# Patient Record
Sex: Female | Born: 1990 | Race: Black or African American | Hispanic: No | Marital: Single | Smoking: Former smoker
Health system: Southern US, Community
[De-identification: ages and names within clinical notes are randomized; demographics above are authoritative.]

## PROBLEM LIST (undated history)

## (undated) ENCOUNTER — Inpatient Hospital Stay (HOSPITAL_COMMUNITY): Payer: Self-pay

## (undated) DIAGNOSIS — T7840XA Allergy, unspecified, initial encounter: Secondary | ICD-10-CM

## (undated) DIAGNOSIS — A749 Chlamydial infection, unspecified: Secondary | ICD-10-CM

## (undated) DIAGNOSIS — A549 Gonococcal infection, unspecified: Secondary | ICD-10-CM

## (undated) DIAGNOSIS — D649 Anemia, unspecified: Secondary | ICD-10-CM

## (undated) DIAGNOSIS — A599 Trichomoniasis, unspecified: Secondary | ICD-10-CM

## (undated) HISTORY — PX: NO PAST SURGERIES: SHX2092

## (undated) HISTORY — DX: Allergy, unspecified, initial encounter: T78.40XA

---
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
## 2012-09-23 ENCOUNTER — Encounter (HOSPITAL_BASED_OUTPATIENT_CLINIC_OR_DEPARTMENT_OTHER): Payer: Self-pay | Admitting: *Deleted

## 2012-09-23 ENCOUNTER — Emergency Department (HOSPITAL_BASED_OUTPATIENT_CLINIC_OR_DEPARTMENT_OTHER)
Admission: EM | Admit: 2012-09-23 | Discharge: 2012-09-23 | Disposition: A | Discharge status: Home / Self Care | Payer: BC Managed Care – PPO | Attending: Emergency Medicine | Admitting: Emergency Medicine

## 2012-09-23 DIAGNOSIS — R1310 Dysphagia, unspecified: Secondary | ICD-10-CM | POA: Insufficient documentation

## 2012-09-23 DIAGNOSIS — J029 Acute pharyngitis, unspecified: Secondary | ICD-10-CM | POA: Insufficient documentation

## 2012-09-23 DIAGNOSIS — Z7982 Long term (current) use of aspirin: Secondary | ICD-10-CM | POA: Insufficient documentation

## 2012-09-23 LAB — COMPREHENSIVE METABOLIC PANEL
ALT: 14 U/L (ref 0–35)
Alkaline Phosphatase: 88 U/L (ref 39–117)
CO2: 25 mEq/L (ref 19–32)
Chloride: 104 mEq/L (ref 96–112)
GFR calc Af Amer: >90 mL/min (ref >90)
GFR calc non Af Amer: >90 mL/min (ref >90)
Glucose, Bld: 96 mg/dL (ref 70–99)
Potassium: 3.3 mEq/L — ABNORMAL LOW (ref 3.5–5.1)
Sodium: 140 mEq/L (ref 135–145)
Total Protein: 7.2 g/dL (ref 6.0–8.3)

## 2012-09-23 LAB — CBC WITH DIFFERENTIAL/PLATELET
Lymphocytes Relative: 30 % (ref 12–46)
Lymphs Abs: 2 10*3/uL (ref 0.7–4.0)
Neutro Abs: 4 10*3/uL (ref 1.7–7.7)
Neutrophils Relative %: 58 % (ref 43–77)
Platelets: 237 10*3/uL (ref 150–400)
RBC: 4.3 MIL/uL (ref 3.87–5.11)
WBC: 6.8 10*3/uL (ref 4.0–10.5)

## 2012-09-23 LAB — RAPID STREP SCREEN (MED CTR MEBANE ONLY): Streptococcus, Group A Screen (Direct): NEGATIVE

## 2012-09-23 MED ORDER — SODIUM CHLORIDE 0.9 % IV SOLN
1000.0000 mL | INTRAVENOUS | Status: DC
  Administered 2012-09-23: 1000 mL via INTRAVENOUS

## 2012-09-23 MED ORDER — DEXAMETHASONE SODIUM PHOSPHATE 10 MG/ML IJ SOLN
10.0000 mg | Freq: Once | INTRAMUSCULAR | Status: AC
  Administered 2012-09-23: 10 mg via INTRAVENOUS
  Filled 2012-09-23: qty 1

## 2012-09-23 MED ORDER — TRAMADOL HCL 50 MG PO TABS
50.0000 mg | ORAL_TABLET | Freq: Four times a day (QID) | ORAL | Status: DC | PRN
Start: 2012-09-23 — End: 2013-09-02

## 2012-09-23 MED ORDER — SODIUM CHLORIDE 0.9 % IV SOLN
1000.0000 mL | Freq: Once | INTRAVENOUS | Status: AC
  Administered 2012-09-23: 1000 mL via INTRAVENOUS

## 2012-09-23 NOTE — ED Notes (Signed)
Pt c/o sore throat x 3 weeks.  

## 2012-09-23 NOTE — ED Provider Notes (Signed)
History    CSN: [id number] Arrival date & time 09/23/12  0054  First MD Initiated Contact with Patient 09/23/12 0130     Chief Complaint  Patient presents with  . Sore Throat   (Consider location/radiation/quality/duration/timing/severity/associated sxs/prior Treatment) Patient is a 22 y.o. female presenting with pharyngitis. The history is provided by the patient.  Sore Throat  She has had a sore throat for the last 3 weeks which is getting worse. Pain started off in the left side and then moved to the right side and now hurts on both sides. Pain is severe and she rates it 10/10. It is worse with swallowing. She denies fever, chills, sweats. She denies rhinorrhea or nasal congestion. There's been no cough, abdominal pain, nausea, vomiting, diarrhea. She has had some mild body aches. She has not been eating or drinking well because of pain with swallowing. She denies any sick contacts. She is not anything to try and help the pain other than salt water gargles. History reviewed. No pertinent past medical history. History reviewed. No pertinent past surgical history. History reviewed. No pertinent family history. History  Substance Use Topics  . Smoking status: Never Smoker   . Smokeless tobacco: Not on file  . Alcohol Use: No   OB History   Grav Para Term Preterm Abortions TAB SAB Ect Mult Living                 Review of Systems  All other systems reviewed and are negative.    Allergies  A & d and Fish allergy  Home Medications   Current Outpatient Rx  Name  Route  Sig  Dispense  Refill  . aspirin 325 MG tablet   Oral   Take 650 mg by mouth daily. For cramps.         Marland Kitchen azithromycin (ZITHROMAX) 250 MG tablet      Take 4 azithromycin 250 mg tablets one week from now, on December 21, 2011.   4 tablet   0   . Ibuprofen-Diphenhydramine Cit (ADVIL PM PO)   Oral   Take 2 tablets by mouth daily as needed. For sleep.          BP 124/76  Pulse 84  Temp(Src) 98.8  F (37.1 C) (Oral)  Resp 16  Ht 5\' 8"  (1.727 m)  Wt 222 lb (100.699 kg)  BMI 33.76 kg/m2  SpO2 100%  LMP 09/23/2012 Physical Exam  Nursing note and vitals reviewed.  22 year old female, resting comfortably and in no acute distress. Vital signs are normal. Oxygen saturation is 100%, which is normal. Head is normocephalic and atraumatic. PERRLA, EOMI. Oropharynx is clear. Tonsils are mildly erythematous and mildly to moderately hypertrophic. No exudate is seen. She does not have any drooling or pooling of secretions and phonation is normal. Neck is nontender and supple with moderate bilateral anterior and posterior cervical lymphadenopathy. Back is nontender and there is no CVA tenderness. Lungs are clear without rales, wheezes, or rhonchi. Chest is nontender. Heart has regular rate and rhythm without murmur. Abdomen is soft, flat, nontender without masses or hepatosplenomegaly and peristalsis is normoactive. Extremities have no cyanosis or edema, full range of motion is present. Skin is warm and dry without rash. Neurologic: Mental status is normal, cranial nerves are intact, there are no motor or sensory deficits.  ED Course  Procedures (including critical care time) Results for orders placed during the hospital encounter of 09/23/12  RAPID STREP SCREEN  Result Value Range   Streptococcus, Group A Screen (Direct) NEGATIVE  NEGATIVE  MONONUCLEOSIS SCREEN      Result Value Range   Mono Screen NEGATIVE  NEGATIVE  CBC WITH DIFFERENTIAL      Result Value Range   WBC 6.8  4.0 - 10.5 K/uL   RBC 4.30  3.87 - 5.11 MIL/uL   Hemoglobin 12.0  12.0 - 15.0 g/dL   HCT 16.1  09.6 - 04.5 %   MCV 85.3  78.0 - 100.0 fL   MCH 27.9  26.0 - 34.0 pg   MCHC 32.7  30.0 - 36.0 g/dL   RDW 40.9  81.1 - 91.4 %   Platelets 237  150 - 400 K/uL   Neutrophils Relative % 58  43 - 77 %   Neutro Abs 4.0  1.7 - 7.7 K/uL   Lymphocytes Relative 30  12 - 46 %   Lymphs Abs 2.0  0.7 - 4.0 K/uL   Monocytes  Relative 7  3 - 12 %   Monocytes Absolute 0.5  0.1 - 1.0 K/uL   Eosinophils Relative 4  0 - 5 %   Eosinophils Absolute 0.3  0.0 - 0.7 K/uL   Basophils Relative 0  0 - 1 %   Basophils Absolute 0.0  0.0 - 0.1 K/uL  COMPREHENSIVE METABOLIC PANEL      Result Value Range   Sodium 140  135 - 145 mEq/L   Potassium 3.3 (*) 3.5 - 5.1 mEq/L   Chloride 104  96 - 112 mEq/L   CO2 25  19 - 32 mEq/L   Glucose, Bld 96  70 - 99 mg/dL   BUN 7  6 - 23 mg/dL   Creatinine, Ser 7.82  0.50 - 1.10 mg/dL   Calcium 9.5  8.4 - 95.6 mg/dL   Total Protein 7.2  6.0 - 8.3 g/dL   Albumin 3.6  3.5 - 5.2 g/dL   AST 19  0 - 37 U/L   ALT 14  0 - 35 U/L   Alkaline Phosphatase 88  39 - 117 U/L   Total Bilirubin 0.7  0.3 - 1.2 mg/dL   GFR calc non Af Amer >90  >90 mL/min   GFR calc Af Amer >90  >90 mL/min   1. Pharyngitis     MDM  Pharyngitis which is probably viral. Consider infectious mononucleosis. Strep screen will be obtained to streptococcal pharyngitis. She'll be given IV fluids and IV dexamethasone.  She feels significantly better after Proventil treatment. Just screen, Mono screen are negative. WBC is normal without any right shift. Liver functions are normal. She appears to have a viral pharyngitis but to courses prolonged compared with what would be expected. She is referred to ENT for followup. She is sent home with prescription for tramadol for pain.  Dione Booze, MD 09/23/12 [phone]

## 2012-09-23 NOTE — ED Notes (Signed)
MD at bedside. 

## 2012-09-24 LAB — CULTURE, GROUP A STREP

## 2013-04-22 ENCOUNTER — Emergency Department (HOSPITAL_BASED_OUTPATIENT_CLINIC_OR_DEPARTMENT_OTHER)
Admission: EM | Admit: 2013-04-22 | Discharge: 2013-04-23 | Disposition: A | Discharge status: Home / Self Care | Payer: No Typology Code available for payment source | Attending: Emergency Medicine | Admitting: Emergency Medicine

## 2013-04-22 ENCOUNTER — Emergency Department (HOSPITAL_BASED_OUTPATIENT_CLINIC_OR_DEPARTMENT_OTHER): Payer: No Typology Code available for payment source

## 2013-04-22 ENCOUNTER — Encounter (HOSPITAL_BASED_OUTPATIENT_CLINIC_OR_DEPARTMENT_OTHER): Payer: Self-pay | Admitting: Emergency Medicine

## 2013-04-22 DIAGNOSIS — N739 Female pelvic inflammatory disease, unspecified: Secondary | ICD-10-CM | POA: Insufficient documentation

## 2013-04-22 DIAGNOSIS — K5900 Constipation, unspecified: Secondary | ICD-10-CM | POA: Insufficient documentation

## 2013-04-22 DIAGNOSIS — N390 Urinary tract infection, site not specified: Secondary | ICD-10-CM | POA: Insufficient documentation

## 2013-04-22 DIAGNOSIS — Z7982 Long term (current) use of aspirin: Secondary | ICD-10-CM | POA: Insufficient documentation

## 2013-04-22 DIAGNOSIS — N730 Acute parametritis and pelvic cellulitis: Secondary | ICD-10-CM

## 2013-04-22 DIAGNOSIS — Z3202 Encounter for pregnancy test, result negative: Secondary | ICD-10-CM | POA: Insufficient documentation

## 2013-04-22 LAB — URINALYSIS, ROUTINE W REFLEX MICROSCOPIC
Bilirubin Urine: NEGATIVE
GLUCOSE, UA: NEGATIVE mg/dL
KETONES UR: NEGATIVE mg/dL
Nitrite: NEGATIVE
PROTEIN: NEGATIVE mg/dL
Specific Gravity, Urine: 1.024 (ref 1.005–1.030)
UROBILINOGEN UA: 0.2 mg/dL (ref 0.0–1.0)
pH: 6 (ref 5.0–8.0)

## 2013-04-22 LAB — URINE MICROSCOPIC-ADD ON

## 2013-04-22 LAB — PREGNANCY, URINE: PREG TEST UR: NEGATIVE

## 2013-04-22 NOTE — ED Notes (Signed)
Pt c/o abd pain ? Constipation and vaginal discharge x 3 months

## 2013-04-22 NOTE — ED Provider Notes (Signed)
CSN: [id number]     Arrival date & time 04/22/13  2247 History  This chart was scribed for Dr. Read DriversMolpus by Valera CastleSteven Perry, ED Scribe. This patient was seen in room MH02/MH02 and the patient's care was started at 11:45 PM.   Chief Complaint  Patient presents with  . Back Pain    The history is provided by the patient. No language interpreter was used.   HPI Comments: Alexandra Nguyen is a 23 y.o. female who presents to the Emergency Department complaining of constant, lower back pain, onset 3 days ago, with associated cramping pain across her lower abdomen. She also reports constipation, onset yesterday. She reports a h/o frequent constipation. She reports being prescribed laxative, but states she has not been taking it. She reports vaginal bleeding and irriation, but denies discharge. She reports taking Monistat 03/26/2013, since she thought it was a yeast infection, but denies relief. She denies any other associated symptoms.   PCP - No primary provider on file.  History reviewed. No pertinent past medical history. History reviewed. No pertinent past surgical history. History reviewed. No pertinent family history. History  Substance Use Topics  . Smoking status: Never Smoker   . Smokeless tobacco: Not on file  . Alcohol Use: No   OB History   Grav Para Term Preterm Abortions TAB SAB Ect Mult Living                 Review of Systems  All other systems reviewed and are negative.   Allergies  A & d and Fish allergy  Home Medications   Current Outpatient Rx  Name  Route  Sig  Dispense  Refill  . aspirin 325 MG tablet   Oral   Take 650 mg by mouth daily. For cramps.         Marland Kitchen. azithromycin (ZITHROMAX) 250 MG tablet      Take 4 azithromycin 250 mg tablets one week from now, on December 21, 2011.   4 tablet   0   . Ibuprofen-Diphenhydramine Cit (ADVIL PM PO)   Oral   Take 2 tablets by mouth daily as needed. For sleep.         . traMADol (ULTRAM) 50 MG tablet   Oral    Take 1 tablet (50 mg total) by mouth every 6 (six) hours as needed for pain.   15 tablet   0    BP 121/81  Pulse 86  Temp(Src) 99 F (37.2 C) (Oral)  Resp 16  Ht 5\' 8"  (1.727 m)  Wt 217 lb (98.431 kg)  BMI 33.00 kg/m2  SpO2 100%  LMP 03/26/2013  Physical Exam  Nursing note and vitals reviewed. Constitutional: She is oriented to person, place, and time. She appears well-developed and well-nourished. No distress.  HENT:  Head: Normocephalic and atraumatic.  Eyes: EOM are normal. Pupils are equal, round, and reactive to light.  Neck: Neck supple.  Cardiovascular: Normal rate, regular rhythm, normal heart sounds and intact distal pulses.   No murmur heard. Pulmonary/Chest: Effort normal and breath sounds normal. No respiratory distress. She has no wheezes. She has no rales.  Abdominal: Soft. Bowel sounds are normal. She exhibits no distension. There is tenderness (diffuse abdominal tenderness.).  Genitourinary:  Bilateral flank tenderness to palpation. Normal external genitalia. No vaginal bleeding. Greenish vaginal discharge and discharge per os. Cervical motion tenderness. Bilateral adnexal tenderness.   Musculoskeletal: Normal range of motion. She exhibits no edema.  No deformity, FROM.  Neurological: She is  alert and oriented to person, place, and time.  Skin: Skin is warm and dry.  Psychiatric: She has a normal mood and affect. Her behavior is normal.  Rectal Exam: Normal sphinctal tone. No stool in vault.   ED Course  Procedures (including critical care time)  COORDINATION OF CARE: 11:50 PM-Discussed treatment plan which includes pelvic exam with pt at bedside and pt agreed to plan.   MDM  No diagnosis found.  Nursing notes and vitals signs, including pulse oximetry, reviewed.  Summary of this visit's results, reviewed by myself:  Labs:  Results for orders placed during the hospital encounter of 04/22/13 (from the past 24 hour(s))  URINALYSIS, ROUTINE W REFLEX  MICROSCOPIC     Status: Abnormal   Collection Time    04/22/13 11:10 PM      Result Value Range   Color, Urine YELLOW  YELLOW   APPearance CLOUDY (*) CLEAR   Specific Gravity, Urine 1.024  1.005 - 1.030   pH 6.0  5.0 - 8.0   Glucose, UA NEGATIVE  NEGATIVE mg/dL   Hgb urine dipstick SMALL (*) NEGATIVE   Bilirubin Urine NEGATIVE  NEGATIVE   Ketones, ur NEGATIVE  NEGATIVE mg/dL   Protein, ur NEGATIVE  NEGATIVE mg/dL   Urobilinogen, UA 0.2  0.0 - 1.0 mg/dL   Nitrite NEGATIVE  NEGATIVE   Leukocytes, UA MODERATE (*) NEGATIVE  PREGNANCY, URINE     Status: None   Collection Time    04/22/13 11:10 PM      Result Value Range   Preg Test, Ur NEGATIVE  NEGATIVE  URINE MICROSCOPIC-ADD ON     Status: Abnormal   Collection Time    04/22/13 11:10 PM      Result Value Range   Squamous Epithelial / LPF FEW (*) RARE   WBC, UA 11-20  <3 WBC/hpf   RBC / HPF 7-10  <3 RBC/hpf   Bacteria, UA FEW (*) RARE   Urine-Other MUCOUS PRESENT    WET PREP, GENITAL     Status: Abnormal   Collection Time    04/22/13 11:50 PM      Result Value Range   Yeast Wet Prep HPF POC NONE SEEN  NONE SEEN   Trich, Wet Prep NONE SEEN  NONE SEEN   Clue Cells Wet Prep HPF POC MODERATE (*) NONE SEEN   WBC, Wet Prep HPF POC MODERATE (*) NONE SEEN     Imaging Studies: Dg Abd 1 View  04/23/2013   CLINICAL DATA:  Constipation  EXAM: ABDOMEN - 1 VIEW  COMPARISON:  None.  FINDINGS: The bowel gas pattern is normal. No radio-opaque calculi or other significant radiographic abnormality are seen.  IMPRESSION: No acute finding or significant stool burden.   Electronically Signed   By: Ruel Favors M.D.   On: 04/23/2013 00:33   Will treat for PID and UTI.   I personally performed the services described in this documentation, which was scribed in my presence.  The recorded information has been reviewed and is accurate. Scribe was not present during pelvic or rectal examinations.    Hanley Seamen, MD 04/23/13 [phone]

## 2013-04-23 LAB — WET PREP, GENITAL
Trich, Wet Prep: NONE SEEN
Yeast Wet Prep HPF POC: NONE SEEN

## 2013-04-23 MED ORDER — NITROFURANTOIN MONOHYD MACRO 100 MG PO CAPS: 100.0000 mg | ORAL_CAPSULE | Freq: Two times a day (BID) | ORAL | Status: DC

## 2013-04-23 MED ORDER — AZITHROMYCIN 250 MG PO TABS
ORAL_TABLET | ORAL | Status: AC
Start: 2013-04-23 — End: 2013-04-23
  Administered 2013-04-23: 1000 mg
  Filled 2013-04-23: qty 4

## 2013-04-23 MED ORDER — LIDOCAINE HCL (PF) 1 % IJ SOLN
INTRAMUSCULAR | Status: AC
  Filled 2013-04-23: qty 5

## 2013-04-23 MED ORDER — CEFTRIAXONE SODIUM 250 MG IJ SOLR
250.0000 mg | Freq: Once | INTRAMUSCULAR | Status: AC
  Administered 2013-04-23: 250 mg via INTRAMUSCULAR
  Filled 2013-04-23: qty 250

## 2013-04-23 MED ORDER — AZITHROMYCIN 1 G PO PACK: 1.0000 g | PACK | Freq: Once | ORAL | Status: DC

## 2013-04-23 MED ORDER — AZITHROMYCIN 250 MG PO TABS
ORAL_TABLET | ORAL | Status: DC
Start: 2013-04-23 — End: 2013-09-02

## 2013-04-24 ENCOUNTER — Telehealth (HOSPITAL_COMMUNITY): Payer: Self-pay

## 2013-04-24 LAB — URINE CULTURE

## 2013-04-24 LAB — GC/CHLAMYDIA PROBE AMP
CT PROBE, AMP APTIMA: NEGATIVE
GC PROBE AMP APTIMA: POSITIVE — AB

## 2013-04-24 NOTE — ED Notes (Signed)
Call rcvd from pt.  ID verified x 2.  Pt (+) for gonorrhea. Treated with Rx for Zithromax and Rocephin injection while in the ED.  Pt informed to notify partner(s) for testing and tx and abstain from sex x 2 wks from tx date.  [employer]HS form completed and faxed. .Marland Kitchen

## 2013-04-25 NOTE — ED Notes (Signed)
+  Gonorrhea. Patient treated with Rocephin and Zithromax. [employer]HS faxed. 

## 2013-09-02 ENCOUNTER — Ambulatory Visit: Payer: Self-pay | Admitting: Family Medicine

## 2013-09-02 ENCOUNTER — Ambulatory Visit (INDEPENDENT_AMBULATORY_CARE_PROVIDER_SITE_OTHER): Payer: No Typology Code available for payment source | Admitting: Family Medicine

## 2013-09-02 ENCOUNTER — Encounter: Payer: Self-pay | Admitting: Family Medicine

## 2013-09-02 VITALS — BP 122/82 | HR 64 | Temp 98.1°F | Resp 16 | Ht 65.5 in | Wt 213.4 lb

## 2013-09-02 DIAGNOSIS — Z0000 Encounter for general adult medical examination without abnormal findings: Secondary | ICD-10-CM

## 2013-09-02 DIAGNOSIS — Z0289 Encounter for other administrative examinations: Secondary | ICD-10-CM

## 2013-09-02 DIAGNOSIS — N898 Other specified noninflammatory disorders of vagina: Secondary | ICD-10-CM

## 2013-09-02 LAB — POCT WET PREP WITH KOH
Clue Cells Wet Prep HPF POC: 50
KOH Prep POC: NEGATIVE
RBC Wet Prep HPF POC: NEGATIVE
Trichomonas, UA: NEGATIVE
Yeast Wet Prep HPF POC: NEGATIVE

## 2013-09-02 MED ORDER — METRONIDAZOLE 500 MG PO TABS: 500.0000 mg | ORAL_TABLET | Freq: Three times a day (TID) | ORAL | Status: DC

## 2013-09-02 NOTE — Progress Notes (Signed)
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  Signed, Sheila Oats.D.

## 2013-09-02 NOTE — Progress Notes (Signed)
Is a 23 year old woman who is in college studying psychology and dance. 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  Couple months the patient was treated for gonorrhea was given a shot and for very large tablets to take. She continued to have vaginal discharge since. She has no bone pain or fever. She has no dysuria.  Patient describes a vaginal discharge as having a fishy odor and is constant.  Objective: HEENT normal Neck: Supple no adenopathy or thyromegaly Chest: Clear Heart: Regular no murmur or gallop Abdomen: Soft nontender without HSM or masses Pelvic exam: Moderate yellow-white smooth discharge with fishy odor, normal cervix, normal external genitalia Skin: Unremarkable Extremities: Moving 4 extremities equally and without difficulty, gait normal Neurological: Normal cranial nerves III through XII, normal mental status, normal motor function  Results for orders placed in visit on 09/02/13  POCT WET PREP WITH KOH      Result Value Ref Range   Trichomonas, UA Negative     Clue Cells Wet Prep HPF POC 50%     Epithelial Wet Prep HPF POC 20-30     Yeast Wet Prep HPF POC NEG     Bacteria Wet Prep HPF POC 1+     RBC Wet Prep HPF POC NEG     WBC Wet Prep HPF POC 2-3     KOH Prep POC Negative       Vaginal discharge - Plan: POCT Wet Prep with KOH, RPR, HIV antibody, GC/Chlamydia Probe Amp  Annual physical exam  Signed, Elvina Sidle, MD

## 2013-09-02 NOTE — Patient Instructions (Signed)
Health Maintenance, 44- to 21-Year-Old SCHOOL PERFORMANCE After high school completion, the young adult may be attending college, Hotel manager or vocational school, or entering the [employer] or the work force. SOCIAL AND EMOTIONAL DEVELOPMENT The young adult establishes adult relationships and explores sexual identity. Young adults may be living at home or in a college dorm or apartment. Increasing independence is important with young adults. Throughout these years, young adults should assume responsibility of their own health care. RECOMMENDED IMMUNIZATIONS  Influenza vaccine.  All adults should be immunized every year.  All adults, including pregnant women and people with hives-only allergy to eggs can receive the inactivated influenza (IIV) vaccine.  Adults aged 44 49 years can receive the recombinant influenza (RIV) vaccine. The RIV vaccine does not contain any egg protein.  Tetanus, diphtheria, and acellular pertussis (Td, Tdap) vaccine.  Pregnant women should receive 1 dose of Tdap vaccine during each pregnancy. The dose should be obtained regardless of the length of time since the last dose. Immunization is preferred during the 27th to 36th week of gestation.  An adult who has not previously received Tdap or who does not know his or her vaccine status should receive 1 dose of Tdap. This initial dose should be followed by tetanus and diphtheria toxoids (Td) booster doses every 10 years.  Adults with an unknown or incomplete history of completing a 3-dose immunization series with Td-containing vaccines should begin or complete a primary immunization series including a Tdap dose.  Adults should receive a Td booster every 10 years.  Varicella vaccine.  An adult without evidence of immunity to varicella should receive 2 doses or a second dose if he or she has previously received 1 dose.  Pregnant females who do not have evidence of immunity should receive the first dose after pregnancy.  This first dose should be obtained before leaving the health care facility. The second dose should be obtained 4 8 weeks after the first dose.  Human papillomavirus (HPV) vaccine.  Females aged 15 26 years who have not received the vaccine previously should obtain the 3-dose series.  The vaccine is not recommended for use in pregnant females. However, pregnancy testing is not needed before receiving a dose. If a female is found to be pregnant after receiving a dose, no treatment is needed. In that case, the remaining doses should be delayed until after the pregnancy.  Males aged 12 21 years who have not received the vaccine previously should receive the 3-dose series. Males aged 39 26 years may be immunized.  Immunization is recommended through the age of 1 years for any female who has sex with males and did not get any or all doses earlier.  Immunization is recommended for any person with an immunocompromised condition through the age of 27 years if he or she did not get any or all doses earlier.  During the 3-dose series, the second dose should be obtained 4 8 weeks after the first dose. The third dose should be obtained 24 weeks after the first dose and 16 weeks after the second dose.  Measles, mumps, and rubella (MMR) vaccine.  Adults born in [birth year] or later should have 1 or more doses of MMR vaccine unless there is a contraindication to the vaccine or there is laboratory evidence of immunity to each of the three diseases.  A routine second dose of MMR vaccine should be obtained at least 28 days after the first dose for students attending postsecondary schools, health care workers, or international travelers.  For females of childbearing age, rubella immunity should be determined. If there is no evidence of immunity, females who are not pregnant should be vaccinated. If there is no evidence of immunity, females who are pregnant should delay immunization until after pregnancy.  Pneumococcal  13-valent conjugate (PCV13) vaccine.  When indicated, a person who is uncertain of his or her immunization history and has no record of immunization should receive the PCV13 vaccine.  An adult aged 19 years or older who has certain medical conditions and has not been previously immunized should receive 1 dose of PCV13 vaccine. This PCV13 should be followed with a dose of pneumococcal polysaccharide (PPSV23) vaccine. The PPSV23 vaccine dose should be obtained at least 8 weeks after the dose of PCV13 vaccine.  An adult aged 19 years or older who has certain medical conditions and previously received 1 or more doses of PPSV23 vaccine should receive 1 dose of PCV13. The PCV13 vaccine dose should be obtained 1 or more years after the last PPSV23 vaccine dose.  Pneumococcal polysaccharide (PPSV23) vaccine.  When PCV13 is also indicated, PCV13 should be obtained first.  An adult younger than age 65 years who has certain medical conditions should be immunized.  Any person who resides in a nursing home or long-term care facility should be immunized.  An adult smoker should be immunized.  People with an immunocompromised condition and certain other conditions should receive both PCV13 and PPSV23 vaccines.  People with human immunodeficiency virus (HIV) infection should be immunized as soon as possible after diagnosis.  Immunization during chemotherapy or radiation therapy should be avoided.  Routine use of PPSV23 vaccine is not recommended for American Indians, [place] Natives, or people younger than 65 years unless there are medical conditions that require PPSV23 vaccine.  When indicated, people who have unknown immunization and have no record of immunization should receive PPSV23 vaccine.  One-time revaccination 5 years after the first dose of PPSV23 is recommended for people aged 19 64 years who have chronic kidney failure, nephrotic syndrome, asplenia, or immunocompromised  conditions.  Meningococcal vaccine.  Adults with asplenia or persistent complement component deficiencies should receive 2 doses of quadrivalent meningococcal conjugate (MenACWY-D) vaccine. The doses should be obtained at least 2 months apart.  Microbiologists working with certain meningococcal bacteria, military recruits, people at risk during an outbreak, and people who travel to or live in countries with a high rate of meningitis should be immunized.  A first-year college student up through age 21 years who is living in a residence hall should receive a dose if he or she did not receive a dose on or after his or her 16th birthday.  Adults who have certain high-risk conditions should receive one or more doses of vaccine.  Hepatitis A vaccine.  Adults who wish to be protected from this disease, have certain high-risk conditions, work with hepatitis A-infected animals, work in hepatitis A research labs, or travel to or work in countries with a high rate of hepatitis A should be immunized.  Adults who were previously unvaccinated and who anticipate close contact with an international adoptee during the first 60 days after arrival in the [place] from a country with a high rate of hepatitis A should be immunized.  Hepatitis B vaccine.  Adults who wish to be protected from this disease, have certain high-risk conditions, may be exposed to blood or other infectious body fluids, are household contacts or sex partners of hepatitis B positive people, are clients or workers in   certain care facilities, or travel to or work in countries with a high rate of hepatitis B should be immunized.  Haemophilus influenzae type b (Hib) vaccine.  A previously unvaccinated person with asplenia or sickle cell disease or having a scheduled splenectomy should receive 1 dose of Hib vaccine.  Regardless of previous immunization, a recipient of a hematopoietic stem cell transplant should receive a 3-dose series 6  12 months after his or her successful transplant.  Hib vaccine is not recommended for adults with HIV infection. TESTING Annual screening for vision and hearing problems is recommended. Vision should be screened objectively at least once between 18 21 years of age. The young adult may be screened for anemia or tuberculosis. Young adults should have a blood test to check for high cholesterol during this time period. Young adults should be screened for use of alcohol and drugs. If the young adult is sexually active, screening for sexually transmitted infections, pregnancy, or HIV may be performed.  NUTRITION AND ORAL HEALTH  Adequate calcium intake is important. Consume 3 servings of low-fat milk and dairy products daily. For those who do not drink milk or consume dairy products, calcium enriched foods, such as juice, bread, or cereal, dark, leafy greens, or canned fish are alternate sources of calcium.  Drink plenty of water. Limit fruit juice to 8 12 ounces (240 360 mL) each day. Avoid sugary beverages or sodas.  Discourage skipping meals, especially breakfast. Young adults should eat a good variety of vegetables and fruits, as well as lean meats.  Avoid foods high in fat, salt, or sugar, such as candy, chips, and cookies.  Encourage young adults to participate in meal planning and preparation.  Eat meals together as a family whenever possible. Encourage conversation at mealtime.  Limit fast food choices and eating out at restaurants.  Brush teeth twice a day and floss.  Schedule dental exams twice a year. SLEEP Regular sleep habits are important. PHYSICAL, SOCIAL, AND EMOTIONAL DEVELOPMENT  One hour of regular physical activity daily is recommended. Continue to participate in sports.  Encourage young adults to develop their own interests and consider community service or volunteerism.  Provide guidance to the young adult in making decisions about college and work plans.  Make sure  that young adults know that they should never be in a situation that makes them uncomfortable, and they should tell partners if they do not want to engage in sexual activity.  Talk to the young adult about body image. Eating disorders may be noted at this time. Young adults may also be concerned about being overweight. Monitor the young adult for weight gain or loss.  Mood disturbances, depression, anxiety, alcoholism, or attention problems may be noted in young adults. Talk to the caregiver if there are concerns about mental illness.  Negotiate limit setting and independent decision making.  Encourage the young adult to handle conflict without physical violence.  Avoid loud noises which may impair hearing.  Limit television and computer time to 2 hours each day. Individuals who engage in excessive sedentary activity are more likely to become overweight. RISK BEHAVIORS  Sexually active young adults need to take precautions against pregnancy and sexually transmitted infections. Talk to young adults about contraception.  Provide a tobacco-free and drug-free environment for the young adult. Talk to the young adult about drug, tobacco, and alcohol use among friends or at friend's homes. Make sure the young adult knows that smoking tobacco or marijuana and taking drugs have health consequences and   may impact brain development.  Teach the young adult about appropriate use of over-the-counter or prescription medicines.  Establish guidelines for driving and for riding with friends.  Talk to young adults about the risks of drinking and driving or boating. Encourage the young adult to call you if he or she or friends have been drinking or using drugs.  Remind young adults to wear seat belts at all times in cars and life vests in boats.  Young adults should always wear a properly fitted helmet when they are riding a bicycle.  Use caution with all-terrain vehicles (ATVs) or other motorized  vehicles.  Do not keep handguns in the home. (If you do, the gun and ammunition should be locked separately and out of the young adult's access.)  Equip your home with smoke detectors and change the batteries regularly. Make sure all family members know the fire escape plans for your home.  Teach young adults not to swim alone and not to dive in shallow water.  All individuals should wear sunscreen when out in the sun. This minimizes sunburning. WHAT'S NEXT? Young adults should visit their pediatrician or family physician yearly. By young adulthood, health care should be transitioned to a family physician or internal medicine specialist. Sexually active females may want to begin annual physical exams with a gynecologist. Document Released: 06/07/2006 Document Revised: 07/07/2012 Document Reviewed: 06/27/2006 [facility] Patient Information 2014 [place], [place]. Bacterial Vaginosis Bacterial vaginosis is a vaginal infection that occurs when the normal balance of bacteria in the vagina is disrupted. It results from an overgrowth of certain bacteria. This is the most common vaginal infection in women of childbearing age. Treatment is important to prevent complications, especially in pregnant women, as it can cause a premature delivery. CAUSES  Bacterial vaginosis is caused by an increase in harmful bacteria that are normally present in smaller amounts in the vagina. Several different kinds of bacteria can cause bacterial vaginosis. However, the reason that the condition develops is not fully understood. RISK FACTORS Certain activities or behaviors can put you at an increased risk of developing bacterial vaginosis, including:  Having a new sex partner or multiple sex partners.  Douching.  Using an intrauterine device (IUD) for contraception. Women do not get bacterial vaginosis from toilet seats, bedding, swimming pools, or contact with objects around them. SIGNS AND SYMPTOMS  Some women with  bacterial vaginosis have no signs or symptoms. Common symptoms include:  Grey vaginal discharge.  A fishlike odor with discharge, especially after sexual intercourse.  Itching or burning of the vagina and vulva.  Burning or pain with urination. DIAGNOSIS  Your health care provider will take a medical history and examine the vagina for signs of bacterial vaginosis. A sample of vaginal fluid may be taken. Your health care provider will look at this sample under a microscope to check for bacteria and abnormal cells. A vaginal pH test may also be done.  TREATMENT  Bacterial vaginosis may be treated with antibiotic medicines. These may be given in the form of a pill or a vaginal cream. A second round of antibiotics may be prescribed if the condition comes back after treatment.  HOME CARE INSTRUCTIONS   Only take over-the-counter or prescription medicines as directed by your health care provider.  If antibiotic medicine was prescribed, take it as directed. Make sure you finish it even if you start to feel better.  Do not have sex until treatment is completed.  Tell all sexual partners that you have a vaginal  infection. They should see their health care provider and be treated if they have problems, such as a mild rash or itching.  Practice safe sex by using condoms and only having one sex partner. SEEK MEDICAL CARE IF:   Your symptoms are not improving after 3 days of treatment.  You have increased discharge or pain.  You have a fever. MAKE SURE YOU:   Understand these instructions.  Will watch your condition.  Will get help right away if you are not doing well or get worse. FOR MORE INFORMATION  Centers for Disease Control and Prevention, Division of STD Prevention: [employer]raiser[employer]raud[employer] American Sexual Health Association (ASHA): www.ashastd.org  Document Released: 03/12/2005 Document Revised: 12/31/2012 Document Reviewed: 10/22/2012 [facility] Patient Information 2014 [place].

## 2013-09-03 LAB — GC/CHLAMYDIA PROBE AMP
CT Probe RNA: NEGATIVE
GC Probe RNA: POSITIVE — AB

## 2013-09-03 LAB — HIV ANTIBODY (ROUTINE TESTING W REFLEX): HIV 1&2 Ab, 4th Generation: NONREACTIVE

## 2013-09-03 LAB — RPR

## 2013-09-11 ENCOUNTER — Ambulatory Visit (INDEPENDENT_AMBULATORY_CARE_PROVIDER_SITE_OTHER): Payer: No Typology Code available for payment source | Admitting: Physician Assistant

## 2013-09-11 DIAGNOSIS — N760 Acute vaginitis: Secondary | ICD-10-CM

## 2013-09-11 DIAGNOSIS — A549 Gonococcal infection, unspecified: Secondary | ICD-10-CM

## 2013-09-11 DIAGNOSIS — A5400 Gonococcal infection of lower genitourinary tract, unspecified: Secondary | ICD-10-CM

## 2013-09-11 DIAGNOSIS — B9689 Other specified bacterial agents as the cause of diseases classified elsewhere: Secondary | ICD-10-CM

## 2013-09-11 DIAGNOSIS — A499 Bacterial infection, unspecified: Secondary | ICD-10-CM

## 2013-09-11 MED ORDER — CEFTRIAXONE SODIUM 1 G IJ SOLR
250.0000 mg | Freq: Once | INTRAMUSCULAR | Status: AC
  Administered 2013-09-11: 250 mg via INTRAMUSCULAR

## 2013-09-11 MED ORDER — METRONIDAZOLE 500 MG PO TABS: 500.0000 mg | ORAL_TABLET | Freq: Two times a day (BID) | ORAL | Status: AC

## 2013-09-11 MED ORDER — AZITHROMYCIN 500 MG PO TABS: 1000.0000 mg | ORAL_TABLET | Freq: Every day | ORAL | Status: DC

## 2013-09-11 NOTE — Progress Notes (Signed)
Pt presents to clinic for an injection from her positive gonorrhea test from her 6/10 visit.  She never pick-up the Flagyl for her BV and she continues having vaginal symptoms.

## 2013-09-16 ENCOUNTER — Telehealth: Payer: Self-pay

## 2013-09-17 ENCOUNTER — Ambulatory Visit (INDEPENDENT_AMBULATORY_CARE_PROVIDER_SITE_OTHER): Payer: No Typology Code available for payment source | Admitting: Family Medicine

## 2013-09-17 VITALS — BP 120/82 | HR 66 | Temp 98.1°F | Resp 16

## 2013-09-17 DIAGNOSIS — Z3049 Encounter for surveillance of other contraceptives: Secondary | ICD-10-CM

## 2013-09-17 DIAGNOSIS — Z3042 Encounter for surveillance of injectable contraceptive: Secondary | ICD-10-CM

## 2013-09-17 LAB — POCT URINE PREGNANCY: PREG TEST UR: NEGATIVE

## 2013-09-17 MED ORDER — MEDROXYPROGESTERONE ACETATE 150 MG/ML IM SUSP
150.0000 mg | Freq: Once | INTRAMUSCULAR | Status: AC
  Administered 2013-09-17: 150 mg via INTRAMUSCULAR

## 2013-09-17 NOTE — Patient Instructions (Addendum)
Return in 90 days for next injection.  Watch against weight gain by eating less and exercising more  Use condoms to help prevent sexually transmitted disease

## 2013-09-17 NOTE — Progress Notes (Signed)
Subjective: Patient is here for her Depo-Medrol injection. She used to be on it, but it is been a long time. She has been using condoms, but was just treated for gonorrhea here. She has not had a regular boyfriend right now, but thinks he picked it up when she did have one. She had BV. Her menstrual cycle started 8 days ago and ended four days ago.Marland Kitchen. Her last time she had problems with the Depakote from weight gain. We had a long talk about that. She is a [employer] University, going to graduate in December. Plans to go into psychology.  Objective: Not examined  Assessment: Depo contraception  Plan: Discussed risks of sexual activity. Discussed weight gain from the medication. Check pregnancy test and ever her injection.

## 2013-10-08 ENCOUNTER — Ambulatory Visit (INDEPENDENT_AMBULATORY_CARE_PROVIDER_SITE_OTHER): Payer: No Typology Code available for payment source | Admitting: Family Medicine

## 2013-10-08 ENCOUNTER — Encounter: Payer: Self-pay | Admitting: Family Medicine

## 2013-10-08 DIAGNOSIS — N760 Acute vaginitis: Secondary | ICD-10-CM

## 2013-10-08 DIAGNOSIS — N898 Other specified noninflammatory disorders of vagina: Secondary | ICD-10-CM

## 2013-10-08 DIAGNOSIS — N939 Abnormal uterine and vaginal bleeding, unspecified: Secondary | ICD-10-CM

## 2013-10-08 MED ORDER — METRONIDAZOLE 500 MG PO TABS: 500.0000 mg | ORAL_TABLET | Freq: Two times a day (BID) | ORAL | Status: DC

## 2013-10-08 MED ORDER — FLUCONAZOLE 150 MG PO TABS
150.0000 mg | ORAL_TABLET | Freq: Once | ORAL | Status: DC
Start: 2013-10-08 — End: 2014-03-08

## 2013-10-08 MED ORDER — METRONIDAZOLE 500 MG PO TABS: 500.0000 mg | ORAL_TABLET | Freq: Three times a day (TID) | ORAL | Status: DC

## 2013-10-08 NOTE — Progress Notes (Signed)
Subjective: Patient is here for 2 things. She has developed a white creamy thick discharge from her vagina and itches. She just finished the course of antibiotics not long ago. She had gonorrhea. She has not had sex since then. She also was treated for BV recently. Does not have a over right now.  Also has had a little spotting yesterday. Wondered whether she would have menstrual cycles on the Depo-Provera.  Objective Chose to not examined her today.  Assessment: Probable monilia vaginitis Possibility of recurrence of BV Spotting on Depo-Provera  Plan: Reassurance that spotting does occur on Depo-Provera. If she has further bleeding problems she is to return. Treat for monilia with Diflucan and possible BV with Flagyl Talk to her about sexuality Return as needed

## 2013-10-08 NOTE — Patient Instructions (Addendum)
Take Diflucan one pill single dose for probable yeast  Take metronidazole one twice daily for possible bacterial vaginosis  Vaginitis Vaginitis is an inflammation of the vagina. It is most often caused by a change in the normal balance of the bacteria and yeast that live in the vagina. This change in balance causes an overgrowth of certain bacteria or yeast, which causes the inflammation. There are different types of vaginitis, but the most common types are:  Bacterial vaginosis.  Yeast infection (candidiasis).  Trichomoniasis vaginitis. This is a sexually transmitted infection (STI).  Viral vaginitis.  Atropic vaginitis.  Allergic vaginitis. CAUSES  The cause depends on the type of vaginitis. Vaginitis can be caused by:  Bacteria (bacterial vaginosis).  Yeast (yeast infection).  A parasite (trichomoniasis vaginitis)  A virus (viral vaginitis).  Low hormone levels (atrophic vaginitis). Low hormone levels can occur during pregnancy, breastfeeding, or after menopause.  Irritants, such as bubble baths, scented tampons, and feminine sprays (allergic vaginitis). Other factors can change the normal balance of the yeast and bacteria that live in the vagina. These include:  Antibiotic medicines.  Poor hygiene.  Diaphragms, vaginal sponges, spermicides, birth control pills, and intrauterine devices (IUD).  Sexual intercourse.  Infection.  Uncontrolled diabetes.  A weakened immune system. SYMPTOMS  Symptoms can vary depending on the cause of the vaginitis. Common symptoms include:  Abnormal vaginal discharge.  The discharge is white, gray, or yellow with bacterial vaginosis.  The discharge is thick, white, and cheesy with a yeast infection.  The discharge is frothy and yellow or greenish with trichomoniasis.  A bad vaginal odor.  The odor is fishy with bacterial vaginosis.  Vaginal itching, pain, or swelling.  Painful intercourse.  Pain or burning when  urinating. Sometimes, there are no symptoms. TREATMENT  Treatment will vary depending on the type of infection.   Bacterial vaginosis and trichomoniasis are often treated with antibiotic creams or pills.  Yeast infections are often treated with antifungal medicines, such as vaginal creams or suppositories.  Viral vaginitis has no cure, but symptoms can be treated with medicines that relieve discomfort. Your sexual partner should be treated as well.  Atrophic vaginitis may be treated with an estrogen cream, pill, suppository, or vaginal ring. If vaginal dryness occurs, lubricants and moisturizing creams may help. You may be told to avoid scented soaps, sprays, or douches.  Allergic vaginitis treatment involves quitting the use of the product that is causing the problem. Vaginal creams can be used to treat the symptoms. HOME CARE INSTRUCTIONS   Take all medicines as directed by your caregiver.  Keep your genital area clean and dry. Avoid soap and only rinse the area with water.  Avoid douching. It can remove the healthy bacteria in the vagina.  Do not use tampons or have sexual intercourse until your vaginitis has been treated. Use sanitary pads while you have vaginitis.  Wipe from front to back. This avoids the spread of bacteria from the rectum to the vagina.  Let air reach your genital area.  Wear cotton underwear to decrease moisture buildup.  Avoid wearing underwear while you sleep until your vaginitis is gone.  Avoid tight pants and underwear or nylons without a cotton panel.  Take off wet clothing (especially bathing suits) as soon as possible.  Use mild, non-scented products. Avoid using irritants, such as:  Scented feminine sprays.  Fabric softeners.  Scented detergents.  Scented tampons.  Scented soaps or bubble baths.  Practice safe sex and use condoms. Condoms may  prevent the spread of trichomoniasis and viral vaginitis. SEEK MEDICAL CARE IF:   You have  abdominal pain.  You have a fever or persistent symptoms for more than 2-3 days.  You have a fever and your symptoms suddenly get worse. Document Released: 01/07/2007 Document Revised: 12/05/2011 Document Reviewed: 08/23/2011 [facility] Patient Information 2015 [place], [place]. This information is not intended to replace advice given to you by your health care provider. Make sure you discuss any questions you have with your health care provider.

## 2013-10-20 ENCOUNTER — Telehealth: Payer: Self-pay

## 2013-10-20 NOTE — Telephone Encounter (Signed)
PT STATES SHE CAME IN AND GOT HER DEPO SHOT AND NOW SHE IS SPOTTING REALLY BAD AND FOR THE PAST 2 DAYS IT HAVE GOTTEN WORSE. WOULD LIKE TO KNOW WHAT IS GOING ON. PLEASE CALL [phone]

## 2013-10-20 NOTE — Telephone Encounter (Signed)
lmom for pt to cb

## 2013-10-21 NOTE — Telephone Encounter (Signed)
Spoke to pt, she is aware that this can be a completely normal issue with the depo injection.

## 2013-11-22 ENCOUNTER — Telehealth: Payer: Self-pay

## 2013-11-22 NOTE — Telephone Encounter (Addendum)
Mother dropped off school form for the patient.  Please complete the form, fax a copy to the schools attn:  Ms. Gari Crown and mail a copy of the form to the patient.   Dr. Alwyn Ren    Paper put in Dr. Lelon Perla box   [phone]

## 2013-11-23 NOTE — Telephone Encounter (Signed)
Pt called back to see if this was done.  I told her the form was in Dr. Caryl Never box.  She wants to know if anyone else could please fill this out because it is already past due.  The fax number this is to be sent to is on the form and needs to be sent to the attention of Gari Crown.  Mail the patient a copy once this is done and please call her to tell her once this is completed.

## 2013-11-23 NOTE — Telephone Encounter (Signed)
Spoke to pt, she is aware of dr hoppers schedule, and she would be contacted when her for is completed.

## 2013-11-26 ENCOUNTER — Telehealth: Payer: Self-pay | Admitting: *Deleted

## 2013-11-26 ENCOUNTER — Telehealth: Payer: Self-pay

## 2013-11-26 NOTE — Telephone Encounter (Signed)
See my other note

## 2013-11-26 NOTE — Telephone Encounter (Signed)
Dr. Milus Glazier did the previous physical several months ago. I only saw for 2 brief visits and cannot complete the form.

## 2013-11-26 NOTE — Telephone Encounter (Signed)
Left a detailed message for patient regarding form she left for Dr. Alwyn Ren. Dr. Alwyn Ren did not see her for a physical and cannot fill out form. Dr. Milus Glazier seen her for her last [employer] physical and will not be in till tomorrow. I will put form in Dr. Milus Glazier box.

## 2013-11-26 NOTE — Telephone Encounter (Signed)
Patient needs her physical form filled out and faxed to the school system immediately. Patient states the form is in Dr. Frederik Pear box and wants someone to call her at home and leave a message letting her know once it has been succesfully faxed. Home phone #: [phone]

## 2013-11-27 NOTE — Telephone Encounter (Signed)
Patient called regarding form. Placed her hold briefly to check the status of this for her. Form is ready to pick up in pick-up drawer and was successfully faxed. Tried to call the patient back but she did not answer. I did not leave a voicemail.

## 2014-03-08 ENCOUNTER — Telehealth: Payer: Self-pay

## 2014-03-08 ENCOUNTER — Ambulatory Visit (INDEPENDENT_AMBULATORY_CARE_PROVIDER_SITE_OTHER): Payer: No Typology Code available for payment source | Admitting: Internal Medicine

## 2014-03-08 VITALS — BP 102/66 | HR 66 | Temp 98.5°F | Resp 18 | Wt 217.0 lb

## 2014-03-08 DIAGNOSIS — Z7251 High risk heterosexual behavior: Secondary | ICD-10-CM

## 2014-03-08 DIAGNOSIS — Z124 Encounter for screening for malignant neoplasm of cervix: Secondary | ICD-10-CM

## 2014-03-08 LAB — POCT WET PREP WITH KOH
KOH PREP POC: NEGATIVE
Trichomonas, UA: NEGATIVE
YEAST WET PREP PER HPF POC: NEGATIVE

## 2014-03-08 LAB — POCT URINE PREGNANCY: Preg Test, Ur: NEGATIVE

## 2014-03-08 MED ORDER — NORETHIN ACE-ETH ESTRAD-FE 1.5-30 MG-MCG PO TABS: 1.0000 | ORAL_TABLET | Freq: Every day | ORAL | Status: DC

## 2014-03-08 NOTE — Telephone Encounter (Signed)
Patient wants to know if she is still on her period. Patient called to ask if she was on her period for 4 days then had light bleeding- spotting. Then her period has a heavy flow now after the spotting. Patient really wants to know if she still on her period even though she has no cramping. I don't know her medical history of if she's on something- I didn't feel comfortable to say yes of course. Please advise. Patient says she is coming in if she doesn't get a call back soon.   Best: [phone]

## 2014-03-08 NOTE — Progress Notes (Signed)
Subjective:    Patient ID: Alexandra Nguyen, female    DOB: 1990-09-17, 23 y.o.   MRN: [id number]  HPI Alexandra Nguyen is an otherwise healthy 23yo female presenting with abnormal uterine bleeding.  She states that she had a period 11/25-12/1, had intercourse for the first time in "a while" 12/8 and 12/9 and then started spotting 12/10.  Her vaginal bleeding became heavier and she has been bleeding since 12/10 with slightly heavier flow than a typical period for her.  She states that her periods often occur monthly, last 5-7 days and she changes a super tampon 2-3 times a day with the tampon being about 1/2 way soaked with blood each time.  Her current bleeding is soaking the tampon completely, but she is still changing them 2-3 times a day.  She was concerned with her discharge, vaginal itching and strong odor that she had BV prior to her current vaginal bleeding.  She does not use condoms every time with intercourse, has had chlamydia and gonorrhea in the past and is not currently on birth control since she stopped her depo.  She has never had a pap smear.   No significant past medical or family hx No allergies No medications  Review of Systems  Constitutional: Negative for fever and fatigue.  Eyes: Negative for visual disturbance.  Respiratory: Negative for cough and shortness of breath.   Cardiovascular: Negative for chest pain.  Gastrointestinal: Negative for nausea, vomiting and abdominal pain.  Genitourinary: Positive for vaginal bleeding and vaginal discharge. Negative for dysuria, urgency and frequency.  Skin: Negative for rash.  Neurological: Negative for headaches.  Psychiatric/Behavioral: Negative for confusion.       Objective:   Filed Vitals:   03/08/14 1627  BP: 102/66  Pulse: 66  Temp: 98.5 F (36.9 C)  Resp: 18   Physical Exam  Constitutional: She is oriented to person, place, and time. She appears well-developed and well-nourished. No distress.  HENT:  Head:  Normocephalic and atraumatic.  Mouth/Throat: Oropharynx is clear and moist.  Eyes: Conjunctivae and EOM are normal.  Cardiovascular: Normal rate, regular rhythm and normal heart sounds.   No murmur heard. Pulmonary/Chest: Effort normal and breath sounds normal. No respiratory distress. She has no wheezes. She has no rales.  Abdominal: Soft. Bowel sounds are normal. She exhibits no distension. There is no tenderness.  Genitourinary: There is no lesion on the right labia. There is no lesion on the left labia. There is bleeding in the vagina. No vaginal discharge found.  Musculoskeletal: Normal range of motion.  Neurological: She is alert and oriented to person, place, and time.  Skin: Skin is warm and dry. No rash noted.  Psychiatric: She has a normal mood and affect. Her behavior is normal.       Assessment & Plan:   1. Abnormal uterine bleeding -Pt had first LMP at 11-12yo and since stopping depo has had monthly periods lasting 5-7 days with moderate flow and cramping -Had typical period 11/25-12/1, and then started having vaginal bleeding again 12/10-today with slightly heavier flow than her typical period -Not currently on birth control, last unprotected intercourse 12/8-9 -Vaginal exam without cervical lesions and with blood coming from the os -UPT negative and gonorrhea and chlamydia pending -Likely anovulatory bleeding, and will likely self-resolve in the next few days -Hold on further w/u unless becomes a more chronic issue as this is the first occurrence of abnl bleeding like this -Rx combined OCP as this will help stop bleeding and  regulate periods as well as providing birth control  2. Vaginal discharge -No concerning discharge on exam, but pt c/o discharge, foul odor and itching prior to current bleeding -Wet prep negative for BV, trich and yeast  3. Pre-conception counseling -Pt quit smoking yesterday, encouraged continued cessation -Encouraged her to start taking MVI with  800mcg folate to prevent neural tube defects if she were to become pregnant -Discussed birth control options and pt desires mirena.  Gave names of clinics where she can obtain this -OCP bridge to mirena with condom use to prevent STIs  4 Cervical cancer screening -Obtained pap smear.  Will need repeat testing q652yrs if negative   Dr Brandon MelnickSwerlick assisted with this evaluation. RPD

## 2014-03-08 NOTE — Telephone Encounter (Signed)
Pt has been menstruating for 12 days and is concerned she may have had a tubal pregnancy. She did not have much cramping but there is a possibility that she is pregnant. Advised pt to come into the Urgent care for evaluation. Pt states today is a good day for her to come in.

## 2014-03-09 LAB — GC/CHLAMYDIA PROBE AMP
CT Probe RNA: NEGATIVE
GC PROBE AMP APTIMA: NEGATIVE

## 2014-03-10 LAB — PAP IG, CT-NG, RFX HPV ASCU
CHLAMYDIA PROBE AMP: NEGATIVE
GC PROBE AMP: NEGATIVE

## 2014-03-11 ENCOUNTER — Encounter: Payer: Self-pay | Admitting: Internal Medicine

## 2014-03-15 ENCOUNTER — Telehealth: Payer: Self-pay

## 2014-03-15 NOTE — Telephone Encounter (Signed)
Pt states she came in for vaginal bleeding and we prescribed bc pills,she does not want to take them And she continues to bleed,states she doesn't want to come back in because she feels we didn't do Anything for her on her last visit,please advise   Best phone for pt is [phone]  [facility] [address]

## 2014-03-15 NOTE — Telephone Encounter (Signed)
Clld pt - advsd and explained purpose of BC  - Loestrin Fe. Advsd  Pt that the pills would stop bleeding, regulate periods and provide contraception since she is having unprotected sex. Pt stated that was not explained to her nor did she receive an AVS. I apologized to the patient and asdvsd that [facility] until she schedules an appt to get the Mirena. Pt stated she would now go to the pharmacy and [facility].

## 2014-03-26 NOTE — L&D Delivery Note (Signed)
Delivery Note At 4:54 AM a viable and healthy female was delivered via Vaginal, Spontaneous Delivery (Presentation: Right Occiput Anterior).  APGAR: 8, 9; weight pending .   Placenta status: Intact, Spontaneous.  Cord: 3 vessels with the following complications: None.  Loose nuchal cord x 1.   At the time of placental delivery there was noted to be marked thickening and scaring of the membranes overlying the body of the placenta. Placenta will be sent to pathology for evaluation  Anesthesia: Epidural  Episiotomy: None Lacerations: None Suture Repair: NA Est. Blood Loss (mL): 300  Mom to postpartum.  Baby to Couplet care / Skin to Skin.  Monique Hefty H. 03/01/2015, 5:15 AM

## 2014-07-08 ENCOUNTER — Ambulatory Visit (INDEPENDENT_AMBULATORY_CARE_PROVIDER_SITE_OTHER): Payer: No Typology Code available for payment source | Admitting: Urgent Care

## 2014-07-08 VITALS — BP 110/68 | HR 74 | Temp 98.1°F | Resp 18 | Ht 68.5 in | Wt 207.0 lb

## 2014-07-08 DIAGNOSIS — Z7251 High risk heterosexual behavior: Secondary | ICD-10-CM | POA: Diagnosis not present

## 2014-07-08 DIAGNOSIS — N912 Amenorrhea, unspecified: Secondary | ICD-10-CM

## 2014-07-08 DIAGNOSIS — F1210 Cannabis abuse, uncomplicated: Secondary | ICD-10-CM

## 2014-07-08 DIAGNOSIS — Z3490 Encounter for supervision of normal pregnancy, unspecified, unspecified trimester: Secondary | ICD-10-CM

## 2014-07-08 LAB — POCT URINE PREGNANCY: Preg Test, Ur: POSITIVE

## 2014-07-08 NOTE — Progress Notes (Signed)
    MRN: [id number] DOB: 02/26/1991  Subjective:   Alexandra Nguyen is a 24 y.o. female presenting for chief complaint of pregnacy  Reports missing her cycle April 2016, LMP was 06/02/2014. Patient has never been pregnant. Has 1 sex partner, not in a relationship. Admits ~2 week history of pelvic pressure, morning sickness including nausea and some vomiting the past 3 days. Has not tried any medications. Denies fever, abdominal pain, genital rashes, vaginal discharge, dysuria, hematuria, urinary frequency, cough, shob, chest pain. Admits smoking marijuana daily, drinks alcohol every other day. Denies any other aggravating or relieving factors, no other questions or concerns.  Colin MuldersBrianna currently has no medications in their medication list. She is allergic to a & d and fish allergy.  Colin MuldersBrianna  has a past medical history of Allergy. Also  has no past surgical history on file.  ROS As in subjective.  Objective:   Vitals: BP 110/68 mmHg  Pulse 74  Temp(Src) 98.1 F (36.7 C) (Oral)  Resp 18  Ht 5' 8.5" (1.74 m)  Wt 207 lb (93.895 kg)  BMI 31.01 kg/m2  SpO2 100%  LMP 06/02/2014  Physical Exam  Constitutional: She is well-developed, well-nourished, and in no distress.  HENT:  Mouth/Throat: Oropharynx is clear and moist. No oropharyngeal exudate.  Eyes: Conjunctivae are normal. Right eye exhibits no discharge. Left eye exhibits no discharge. No scleral icterus.  Neck: No thyromegaly present.  Cardiovascular: Normal rate, regular rhythm and intact distal pulses.  Exam reveals no gallop and no friction rub.   No murmur heard. Pulmonary/Chest: No stridor. No respiratory distress. She has no wheezes. She has no rales. She exhibits no tenderness.  Abdominal: Soft. Bowel sounds are normal. She exhibits no distension and no mass. There is no tenderness.  Lymphadenopathy:    She has no cervical adenopathy.  Skin: Skin is warm and dry. No rash noted. No erythema. No pallor.   Results for  orders placed or performed in visit on 07/08/14 (from the past 24 hour(s))  POCT urine pregnancy     Status: None   Collection Time: 07/08/14 11:43 AM  Result Value Ref Range   Preg Test, Ur Positive    Assessment and Plan :   1. Absent menses 2. High risk sexual behavior 3. Pregnancy - Labs pending, counseled on pregnancy, [facility]. Recommended she stop smoking and drinking alcohol.  - Follow up as needed, report results to [phone], VM is okay.  Wallis BambergMario Katalina Magri, PA-C [facility] [phone] 07/08/2014 11:41 AM

## 2014-07-08 NOTE — Patient Instructions (Addendum)
- Please pick up an over-the-counter prenatal vitamin. - Tomorrow we will process your urine for gonorrhea chlamydia.  - I recommend you set up an appointment to establish care at the [facility]. - Please stop smoking and drinking alcohol as these habits have serious consequences to the fetus.   First Trimester of Pregnancy The first trimester of pregnancy is from week 1 until the end of week 12 (months 1 through 3). A week after a sperm fertilizes an egg, the egg will implant on the wall of the uterus. This embryo will begin to develop into a baby. Genes from you and your partner are forming the baby. The female genes determine whether the baby is a boy or a girl. At 6-8 weeks, the eyes and face are formed, and the heartbeat can be seen on ultrasound. At the end of 12 weeks, all the baby's organs are formed.  Now that you are pregnant, you will want to do everything you can to have a healthy baby. Two of the most important things are to get good prenatal care and to follow your health care provider's instructions. Prenatal care is all the medical care you receive before the baby's birth. This care will help prevent, find, and treat any problems during the pregnancy and childbirth. BODY CHANGES Your body goes through many changes during pregnancy. The changes vary from woman to woman.   You may gain or lose a couple of pounds at first.  You may feel sick to your stomach (nauseous) and throw up (vomit). If the vomiting is uncontrollable, call your health care provider.  You may tire easily.  You may develop headaches that can be relieved by medicines approved by your health care provider.  You may urinate more often. Painful urination may mean you have a bladder infection.  You may develop heartburn as a result of your pregnancy.  You may develop constipation because certain hormones are causing the muscles that push waste through your intestines to slow down.  You may develop  hemorrhoids or swollen, bulging veins (varicose veins).  Your breasts may begin to grow larger and become tender. Your nipples may stick out more, and the tissue that surrounds them (areola) may become darker.  Your gums may bleed and may be sensitive to brushing and flossing.  Dark spots or blotches (chloasma, mask of pregnancy) may develop on your face. This will likely fade after the baby is born.  Your menstrual periods will stop.  You may have a loss of appetite.  You may develop cravings for certain kinds of food.  You may have changes in your emotions from day to day, such as being excited to be pregnant or being concerned that something may go wrong with the pregnancy and baby.  You may have more vivid and strange dreams.  You may have changes in your hair. These can include thickening of your hair, rapid growth, and changes in texture. Some women also have hair loss during or after pregnancy, or hair that feels dry or thin. Your hair will most likely return to normal after your baby is born. WHAT TO EXPECT AT YOUR PRENATAL VISITS During a routine prenatal visit:  You will be weighed to make sure you and the baby are growing normally.  Your blood pressure will be taken.  Your abdomen will be measured to track your baby's growth.  The fetal heartbeat will be listened to starting around week 10 or 12 of your pregnancy.  Test results from  any previous visits will be discussed. Your health care provider may ask you:  How you are feeling.  If you are feeling the baby move.  If you have had any abnormal symptoms, such as leaking fluid, bleeding, severe headaches, or abdominal cramping.  If you have any questions. Other tests that may be performed during your first trimester include:  Blood tests to find your blood type and to check for the presence of any previous infections. They will also be used to check for low iron levels (anemia) and Rh antibodies. Later in the  pregnancy, blood tests for diabetes will be done along with other tests if problems develop.  Urine tests to check for infections, diabetes, or protein in the urine.  An ultrasound to confirm the proper growth and development of the baby.  An amniocentesis to check for possible genetic problems.  Fetal screens for spina bifida and Down syndrome.  You may need other tests to make sure you and the baby are doing well. HOME CARE INSTRUCTIONS  Medicines  Follow your health care provider's instructions regarding medicine use. Specific medicines may be either safe or unsafe to take during pregnancy.  Take your prenatal vitamins as directed.  If you develop constipation, try taking a stool softener if your health care provider approves. Diet  Eat regular, well-balanced meals. Choose a variety of foods, such as meat or vegetable-based protein, fish, milk and low-fat dairy products, vegetables, fruits, and whole grain breads and cereals. Your health care provider will help you determine the amount of weight gain that is right for you.  Avoid raw meat and uncooked cheese. These carry germs that can cause birth defects in the baby.  Eating four or five small meals rather than three large meals a day may help relieve nausea and vomiting. If you start to feel nauseous, eating a few soda crackers can be helpful. Drinking liquids between meals instead of during meals also seems to help nausea and vomiting.  If you develop constipation, eat more high-fiber foods, such as fresh vegetables or fruit and whole grains. Drink enough fluids to keep your urine clear or pale yellow. Activity and Exercise  Exercise only as directed by your health care provider. Exercising will help you:  Control your weight.  Stay in shape.  Be prepared for labor and delivery.  Experiencing pain or cramping in the lower abdomen or low back is a good sign that you should stop exercising. Check with your health care  provider before continuing normal exercises.  Try to avoid standing for long periods of time. Move your legs often if you must stand in one place for a long time.  Avoid heavy lifting.  Wear low-heeled shoes, and practice good posture.  You may continue to have sex unless your health care provider directs you otherwise. Relief of Pain or Discomfort  Wear a good support bra for breast tenderness.   Take warm sitz baths to soothe any pain or discomfort caused by hemorrhoids. Use hemorrhoid cream if your health care provider approves.   Rest with your legs elevated if you have leg cramps or low back pain.  If you develop varicose veins in your legs, wear support hose. Elevate your feet for 15 minutes, 3-4 times a day. Limit salt in your diet. Prenatal Care  Schedule your prenatal visits by the twelfth week of pregnancy. They are usually scheduled monthly at first, then more often in the last 2 months before delivery.  Write down your questions.  Take them to your prenatal visits.  Keep all your prenatal visits as directed by your health care provider. Safety  Wear your seat belt at all times when driving.  Make a list of emergency phone numbers, including numbers for family, friends, the hospital, and police and fire departments. General Tips  Ask your health care provider for a referral to a local prenatal education class. Begin classes no later than at the beginning of month 6 of your pregnancy.  Ask for help if you have counseling or nutritional needs during pregnancy. Your health care provider can offer advice or refer you to specialists for help with various needs.  Do not use hot tubs, steam rooms, or saunas.  Do not douche or use tampons or scented sanitary pads.  Do not cross your legs for long periods of time.  Avoid cat litter boxes and soil used by cats. These carry germs that can cause birth defects in the baby and possibly loss of the fetus by miscarriage or  stillbirth.  Avoid all smoking, herbs, alcohol, and medicines not prescribed by your health care provider. Chemicals in these affect the formation and growth of the baby.  Schedule a dentist appointment. At home, brush your teeth with a soft toothbrush and be gentle when you floss. SEEK MEDICAL CARE IF:   You have dizziness.  You have mild pelvic cramps, pelvic pressure, or nagging pain in the abdominal area.  You have persistent nausea, vomiting, or diarrhea.  You have a bad smelling vaginal discharge.  You have pain with urination.  You notice increased swelling in your face, hands, legs, or ankles. SEEK IMMEDIATE MEDICAL CARE IF:   You have a fever.  You are leaking fluid from your vagina.  You have spotting or bleeding from your vagina.  You have severe abdominal cramping or pain.  You have rapid weight gain or loss.  You vomit blood or material that looks like coffee grounds.  You are exposed to [place] measles and have never had them.  You are exposed to fifth disease or chickenpox.  You develop a severe headache.  You have shortness of breath.  You have any kind of trauma, such as from a fall or a car accident. Document Released: 03/06/2001 Document Revised: 07/27/2013 Document Reviewed: 01/20/2013 [facility] Patient Information 2015 [place], [place]. This information is not intended to replace advice given to you by your health care provider. Make sure you discuss any questions you have with your health care provider.

## 2014-07-09 LAB — HIV ANTIBODY (ROUTINE TESTING W REFLEX): HIV 1&2 Ab, 4th Generation: NONREACTIVE

## 2014-07-09 LAB — RPR

## 2014-07-10 LAB — GC/CHLAMYDIA PROBE AMP
CT Probe RNA: NEGATIVE
GC PROBE AMP APTIMA: POSITIVE — AB

## 2014-07-12 ENCOUNTER — Telehealth: Payer: Self-pay | Admitting: Urgent Care

## 2014-07-12 LAB — OB RESULTS CONSOLE GC/CHLAMYDIA
CHLAMYDIA, DNA PROBE: NEGATIVE
GC PROBE AMP, GENITAL: POSITIVE

## 2014-07-12 NOTE — Telephone Encounter (Signed)
Let patient know she needs treatment for gonorrhea infection as her result came back positive. The recommendation is a 1x injection of ceftriaxone which she can come and get in our clinic as well as azithromycin. Patient states she will come in tomorrow 07/13/2014. She also complained of nausea, I gave her general non-pharmacologic interventions but patient states she has tried these. Advised her to pick up Vitamin B6 supplement.  Wallis BambergMario Oaklee Esther, PA-C [facility] [phone] 07/12/2014  4:52 PM

## 2014-07-29 ENCOUNTER — Emergency Department (HOSPITAL_BASED_OUTPATIENT_CLINIC_OR_DEPARTMENT_OTHER): Payer: BLUE CROSS/BLUE SHIELD

## 2014-07-29 ENCOUNTER — Encounter (HOSPITAL_BASED_OUTPATIENT_CLINIC_OR_DEPARTMENT_OTHER): Payer: Self-pay | Admitting: *Deleted

## 2014-07-29 ENCOUNTER — Emergency Department (HOSPITAL_BASED_OUTPATIENT_CLINIC_OR_DEPARTMENT_OTHER)
Admission: EM | Admit: 2014-07-29 | Discharge: 2014-07-29 | Disposition: A | Discharge status: Home / Self Care | Payer: BLUE CROSS/BLUE SHIELD | Attending: Emergency Medicine | Admitting: Emergency Medicine

## 2014-07-29 DIAGNOSIS — N72 Inflammatory disease of cervix uteri: Secondary | ICD-10-CM

## 2014-07-29 DIAGNOSIS — R102 Pelvic and perineal pain: Secondary | ICD-10-CM | POA: Diagnosis not present

## 2014-07-29 DIAGNOSIS — O9989 Other specified diseases and conditions complicating pregnancy, childbirth and the puerperium: Secondary | ICD-10-CM | POA: Diagnosis present

## 2014-07-29 DIAGNOSIS — O23511 Infections of cervix in pregnancy, first trimester: Secondary | ICD-10-CM | POA: Diagnosis not present

## 2014-07-29 DIAGNOSIS — Z3A01 Less than 8 weeks gestation of pregnancy: Secondary | ICD-10-CM | POA: Insufficient documentation

## 2014-07-29 DIAGNOSIS — Z3490 Encounter for supervision of normal pregnancy, unspecified, unspecified trimester: Secondary | ICD-10-CM

## 2014-07-29 LAB — COMPREHENSIVE METABOLIC PANEL
ALBUMIN: 3.8 g/dL (ref 3.5–5.0)
ALT: 17 U/L (ref 14–54)
ANION GAP: 10 (ref 5–15)
AST: 18 U/L (ref 15–41)
Alkaline Phosphatase: 54 U/L (ref 38–126)
BUN: 8 mg/dL (ref 6–20)
CHLORIDE: 102 mmol/L (ref 101–111)
CO2: 22 mmol/L (ref 22–32)
CREATININE: 0.52 mg/dL (ref 0.44–1.00)
Calcium: 9.4 mg/dL (ref 8.9–10.3)
GFR calc Af Amer: >60 mL/min (ref >60)
GFR calc non Af Amer: >60 mL/min (ref >60)
Glucose, Bld: 94 mg/dL (ref 70–99)
Potassium: 3.5 mmol/L (ref 3.5–5.1)
Sodium: 134 mmol/L — ABNORMAL LOW (ref 135–145)
Total Bilirubin: 1.1 mg/dL (ref 0.3–1.2)
Total Protein: 7.2 g/dL (ref 6.5–8.1)

## 2014-07-29 LAB — URINALYSIS, ROUTINE W REFLEX MICROSCOPIC
Bilirubin Urine: NEGATIVE
Glucose, UA: NEGATIVE mg/dL
Hgb urine dipstick: NEGATIVE
LEUKOCYTES UA: NEGATIVE
NITRITE: NEGATIVE
Protein, ur: NEGATIVE mg/dL
SPECIFIC GRAVITY, URINE: 1.024 (ref 1.005–1.030)
Urobilinogen, UA: 1 mg/dL (ref 0.0–1.0)
pH: 7 (ref 5.0–8.0)

## 2014-07-29 LAB — CBC WITH DIFFERENTIAL/PLATELET
BASOS ABS: 0 10*3/uL (ref 0.0–0.1)
BASOS PCT: 0 % (ref 0–1)
EOS ABS: 0 10*3/uL (ref 0.0–0.7)
Eosinophils Relative: 0 % (ref 0–5)
HCT: 37.6 % (ref 36.0–46.0)
Hemoglobin: 12.6 g/dL (ref 12.0–15.0)
Lymphocytes Relative: 23 % (ref 12–46)
Lymphs Abs: 1.8 10*3/uL (ref 0.7–4.0)
MCH: 27.6 pg (ref 26.0–34.0)
MCHC: 33.5 g/dL (ref 30.0–36.0)
MCV: 82.3 fL (ref 78.0–100.0)
MONO ABS: 0.4 10*3/uL (ref 0.1–1.0)
Monocytes Relative: 5 % (ref 3–12)
NEUTROS PCT: 72 % (ref 43–77)
Neutro Abs: 5.6 10*3/uL (ref 1.7–7.7)
Platelets: 240 10*3/uL (ref 150–400)
RBC: 4.57 MIL/uL (ref 3.87–5.11)
RDW: 13 % (ref 11.5–15.5)
WBC: 7.9 10*3/uL (ref 4.0–10.5)

## 2014-07-29 LAB — WET PREP, GENITAL
Trich, Wet Prep: NONE SEEN
Yeast Wet Prep HPF POC: NONE SEEN

## 2014-07-29 LAB — HCG, QUANTITATIVE, PREGNANCY: hCG, Beta Chain, Quant, S: 112514 m[IU]/mL — ABNORMAL HIGH (ref <5)

## 2014-07-29 LAB — PREGNANCY, URINE: PREG TEST UR: POSITIVE — AB

## 2014-07-29 MED ORDER — ONDANSETRON HCL 4 MG PO TABS: 4.0000 mg | ORAL_TABLET | Freq: Four times a day (QID) | ORAL | Status: DC

## 2014-07-29 MED ORDER — CEFTRIAXONE SODIUM 1 G IJ SOLR
1.0000 g | Freq: Once | INTRAMUSCULAR | Status: AC
  Administered 2014-07-29: 1 g via INTRAVENOUS

## 2014-07-29 MED ORDER — AZITHROMYCIN 250 MG PO TABS
1000.0000 mg | ORAL_TABLET | Freq: Once | ORAL | Status: AC
  Administered 2014-07-29: 1000 mg via ORAL
  Filled 2014-07-29: qty 4

## 2014-07-29 MED ORDER — ONDANSETRON HCL 4 MG/2ML IJ SOLN
4.0000 mg | Freq: Once | INTRAMUSCULAR | Status: AC
  Administered 2014-07-29: 4 mg via INTRAVENOUS
  Filled 2014-07-29: qty 2

## 2014-07-29 MED ORDER — SODIUM CHLORIDE 0.9 % IV BOLUS (SEPSIS)
1000.0000 mL | Freq: Once | INTRAVENOUS | Status: AC
  Administered 2014-07-29: 1000 mL via INTRAVENOUS

## 2014-07-29 MED ORDER — CEFTRIAXONE SODIUM 1 G IJ SOLR
INTRAMUSCULAR | Status: AC
  Filled 2014-07-29: qty 10

## 2014-07-29 NOTE — ED Notes (Signed)
Abdominal pain and nausea/vomiting on and off since March.

## 2014-07-29 NOTE — Discharge Instructions (Signed)
Cervicitis Cervicitis is a soreness and swelling (inflammation) of the cervix. Your cervix is located at the bottom of your uterus. It opens up to the vagina. CAUSES   Sexually transmitted infections (STIs).   Allergic reaction.   Medicines or birth control devices that are put in the vagina.   Injury to the cervix.   Bacterial infections.  RISK FACTORS You are at greater risk if you:  Have unprotected sexual intercourse.  Have sexual intercourse with many partners.  Began sexual intercourse at an early age.  Have a history of STIs. SYMPTOMS  There may be no symptoms. If symptoms occur, they may include:   Gray, white, yellow, or bad-smelling vaginal discharge.   Pain or itching of the area outside the vagina.   Painful sexual intercourse.   Lower abdominal or lower back pain, especially during intercourse.   Frequent urination.   Abnormal vaginal bleeding between periods, after sexual intercourse, or after menopause.   Pressure or a heavy feeling in the pelvis.  DIAGNOSIS  Diagnosis is made after a pelvic exam. Other tests may include:   Examination of any discharge under a microscope (wet prep).   A Pap test.  TREATMENT  Treatment will depend on the cause of cervicitis. If it is caused by an STI, both you and your partner will need to be treated. Antibiotic medicines will be given.  HOME CARE INSTRUCTIONS   Do not have sexual intercourse until your health care provider says it is okay.   Do not have sexual intercourse until your partner has been treated, if your cervicitis is caused by an STI.   Take your antibiotics as directed. Finish them even if you start to feel better.  SEEK MEDICAL CARE IF:  Your symptoms come back.   You have a fever.  MAKE SURE YOU:   Understand these instructions.  Will watch your condition.  Will get help right away if you are not doing well or get worse. Document Released: 03/12/2005 Document Revised:  03/17/2013 Document Reviewed: 09/03/2012 [facility] Patient Information 2015 [place]. This information is not intended to replace advice given to you by your health care provider. Make sure you discuss any questions you have with your health care provider. Abdominal Pain During Pregnancy Abdominal pain is common in pregnancy. Most of the time, it does not cause harm. There are many causes of abdominal pain. Some causes are more serious than others. Some of the causes of abdominal pain in pregnancy are easily diagnosed. Occasionally, the diagnosis takes time to understand. Other times, the cause is not determined. Abdominal pain can be a sign that something is very wrong with the pregnancy, or the pain may have nothing to do with the pregnancy at all. For this reason, always tell your health care provider if you have any abdominal discomfort. HOME CARE INSTRUCTIONS  Monitor your abdominal pain for any changes. The following actions may help to alleviate any discomfort you are experiencing:  Do not have sexual intercourse or put anything in your vagina until your symptoms go away completely.  Get plenty of rest until your pain improves.  Drink clear fluids if you feel nauseous. Avoid solid food as long as you are uncomfortable or nauseous.  Only take over-the-counter or prescription medicine as directed by your health care provider.  Keep all follow-up appointments with your health care provider. SEEK IMMEDIATE MEDICAL CARE IF:  You are bleeding, leaking fluid, or passing tissue from the vagina.  You have increasing pain or  cramping.  You have persistent vomiting.  You have painful or bloody urination.  You have a fever.  You notice a decrease in your baby's movements.  You have extreme weakness or feel faint.  You have shortness of breath, with or without abdominal pain.  You develop a severe headache with abdominal pain.  You have abnormal vaginal discharge with abdominal  pain.  You have persistent diarrhea.  You have abdominal pain that continues even after rest, or gets worse. MAKE SURE YOU:   Understand these instructions.  Will watch your condition.  Will get help right away if you are not doing well or get worse. Document Released: 03/12/2005 Document Revised: 12/31/2012 Document Reviewed: 10/09/2012 [facility] Patient Information 2015 [place], [place]. This information is not intended to replace advice given to you by your health care provider. Make sure you discuss any questions you have with your health care provider.

## 2014-07-29 NOTE — ED Provider Notes (Signed)
CSN: [id number]     Arrival date & time 07/29/14  1315 History   First MD Initiated Contact with Patient 07/29/14 1324     Chief Complaint  Patient presents with  . Abdominal Pain     (Consider location/radiation/quality/duration/timing/severity/associated sxs/prior Treatment) HPI Comments: Patient presents to the ER for evaluation of abdominal pain. Patient reports that she's been having symptoms for at least 3 weeks. She is complaining of diffuse abdominal pain has been associated with nausea and vomiting. She has had some mild diarrhea as well. No constipation. Patient reports a vaginal discharge that is unusual. She has not had any unusual vaginal bleeding.  Patient is a 24 y.o. female presenting with abdominal pain.  Abdominal Pain Associated symptoms: vaginal discharge   Associated symptoms: no vaginal bleeding     Past Medical History  Diagnosis Date  . Allergy    History reviewed. No pertinent past surgical history. No family history on file. History  Substance Use Topics  . Smoking status: Never Smoker   . Smokeless tobacco: Not on file  . Alcohol Use: No   OB History    No data available     Review of Systems  Gastrointestinal: Positive for abdominal pain.  Genitourinary: Positive for vaginal discharge and pelvic pain. Negative for vaginal bleeding.  All other systems reviewed and are negative.     Allergies  A & d and Fish allergy  Home Medications   Prior to Admission medications   Not on File   BP 116/83 mmHg  Pulse 105  Temp(Src) 98.1 F (36.7 C) (Oral)  Resp 18  Wt 207 lb (93.895 kg)  SpO2 100%  LMP 06/02/2014 Physical Exam  Constitutional: She is oriented to person, place, and time. She appears well-developed and well-nourished. No distress.  HENT:  Head: Normocephalic and atraumatic.  Right Ear: Hearing normal.  Left Ear: Hearing normal.  Nose: Nose normal.  Mouth/Throat: Oropharynx is clear and moist and mucous membranes are normal.   Eyes: Conjunctivae and EOM are normal. Pupils are equal, round, and reactive to light.  Neck: Normal range of motion. Neck supple.  Cardiovascular: Regular rhythm, S1 normal and S2 normal.  Exam reveals no gallop and no friction rub.   No murmur heard. Pulmonary/Chest: Effort normal and breath sounds normal. No respiratory distress. She exhibits no tenderness.  Abdominal: Soft. Normal appearance and bowel sounds are normal. There is no hepatosplenomegaly. There is no tenderness. There is no rebound, no guarding, no tenderness at McBurney's point and negative Murphy's sign. No hernia.  Genitourinary: Vagina normal and uterus normal. Cervix exhibits motion tenderness and discharge. Right adnexum displays no mass, no tenderness and no fullness. Left adnexum displays no mass, no tenderness and no fullness.  Musculoskeletal: Normal range of motion.  Neurological: She is alert and oriented to person, place, and time. She has normal strength. No cranial nerve deficit or sensory deficit. Coordination normal. GCS eye subscore is 4. GCS verbal subscore is 5. GCS motor subscore is 6.  Skin: Skin is warm, dry and intact. No rash noted. No cyanosis.  Psychiatric: She has a normal mood and affect. Her speech is normal and behavior is normal. Thought content normal.  Nursing note and vitals reviewed.   ED Course  Procedures (including critical care time) Labs Review Labs Reviewed  WET PREP, GENITAL - Abnormal; Notable for the following:    Clue Cells Wet Prep HPF POC FEW (*)    WBC, Wet Prep HPF POC FEW (*)  All other components within normal limits  URINALYSIS, ROUTINE W REFLEX MICROSCOPIC - Abnormal; Notable for the following:    APPearance CLOUDY (*)    Ketones, ur >80 (*)    All other components within normal limits  PREGNANCY, URINE - Abnormal; Notable for the following:    Preg Test, Ur POSITIVE (*)    All other components within normal limits  COMPREHENSIVE METABOLIC PANEL - Abnormal;  Notable for the following:    Sodium 134 (*)    All other components within normal limits  CBC WITH DIFFERENTIAL/PLATELET  RPR  HIV ANTIBODY (ROUTINE TESTING)  HCG, QUANTITATIVE, PREGNANCY  GC/CHLAMYDIA PROBE AMP ([place])    Imaging Review No results found.   EKG Interpretation None      MDM   Final diagnoses:  Pregnancy  Pelvic pain in female  Cervicitis    Patient presents to the emergency department for evaluation of abdominal pain, nausea and vomiting. She reports that symptoms have been ongoing since March. She is complaining of lower abdominal pain, pelvic pain and vaginal discharge. Patient is pregnant. She apparently did not know this, despite having seen her physician 3 weeks ago. Records at that time revealed that she was diagnosed with morning sickness and told to follow-up with OB/GYN.  Patient did have cervical motion tenderness on examination. Will be treated with Rocephin and Zithromax.  Patient undergo OB ultrasound, evaluation for ectopic, etc. Discharge home with treatment for morning sickness and follow-up with OB/GYN if normal. Will sign out to oncoming ER physician to follow-up.    Gilda Creasehristopher J Namrata Dangler, MD 07/29/14 [phone]

## 2014-07-29 NOTE — ED Notes (Signed)
Patient transported to Ultrasound and returned 

## 2014-07-30 LAB — GC/CHLAMYDIA PROBE AMP (~~LOC~~) NOT AT ARMC
CHLAMYDIA, DNA PROBE: NEGATIVE
Neisseria Gonorrhea: POSITIVE — AB

## 2014-07-30 LAB — HIV ANTIBODY (ROUTINE TESTING W REFLEX): HIV SCREEN 4TH GENERATION: NONREACTIVE

## 2014-07-30 LAB — RPR: RPR: NONREACTIVE

## 2014-08-02 ENCOUNTER — Telehealth (HOSPITAL_COMMUNITY): Payer: Self-pay

## 2014-08-02 NOTE — ED Notes (Signed)
Positive for gonorrhea. Treated per protocol. Attempting to contact pt. [employer] faxed

## 2014-08-03 ENCOUNTER — Telehealth (HOSPITAL_BASED_OUTPATIENT_CLINIC_OR_DEPARTMENT_OTHER): Payer: Self-pay | Admitting: Emergency Medicine

## 2014-08-07 ENCOUNTER — Inpatient Hospital Stay (HOSPITAL_COMMUNITY)
Admission: AD | Admit: 2014-08-07 | Discharge: 2014-08-07 | Disposition: A | Discharge status: Home / Self Care | Payer: BLUE CROSS/BLUE SHIELD | Source: Ambulatory Visit | Attending: Obstetrics & Gynecology | Admitting: Obstetrics & Gynecology

## 2014-08-07 ENCOUNTER — Encounter (HOSPITAL_COMMUNITY): Payer: Self-pay

## 2014-08-07 DIAGNOSIS — R42 Dizziness and giddiness: Secondary | ICD-10-CM | POA: Diagnosis present

## 2014-08-07 DIAGNOSIS — O98211 Gonorrhea complicating pregnancy, first trimester: Secondary | ICD-10-CM | POA: Diagnosis not present

## 2014-08-07 DIAGNOSIS — O219 Vomiting of pregnancy, unspecified: Secondary | ICD-10-CM | POA: Diagnosis present

## 2014-08-07 DIAGNOSIS — E860 Dehydration: Secondary | ICD-10-CM

## 2014-08-07 DIAGNOSIS — Z3A09 9 weeks gestation of pregnancy: Secondary | ICD-10-CM | POA: Insufficient documentation

## 2014-08-07 DIAGNOSIS — Z87891 Personal history of nicotine dependence: Secondary | ICD-10-CM | POA: Insufficient documentation

## 2014-08-07 DIAGNOSIS — O211 Hyperemesis gravidarum with metabolic disturbance: Secondary | ICD-10-CM | POA: Insufficient documentation

## 2014-08-07 DIAGNOSIS — R55 Syncope and collapse: Secondary | ICD-10-CM | POA: Diagnosis present

## 2014-08-07 DIAGNOSIS — A5402 Gonococcal vulvovaginitis, unspecified: Secondary | ICD-10-CM | POA: Insufficient documentation

## 2014-08-07 LAB — CBC
HEMATOCRIT: 39.3 % (ref 36.0–46.0)
Hemoglobin: 13.3 g/dL (ref 12.0–15.0)
MCH: 27.9 pg (ref 26.0–34.0)
MCHC: 33.8 g/dL (ref 30.0–36.0)
MCV: 82.6 fL (ref 78.0–100.0)
PLATELETS: 252 10*3/uL (ref 150–400)
RBC: 4.76 MIL/uL (ref 3.87–5.11)
RDW: 13.1 % (ref 11.5–15.5)
WBC: 10.3 10*3/uL (ref 4.0–10.5)

## 2014-08-07 LAB — BASIC METABOLIC PANEL
Anion gap: 10 (ref 5–15)
BUN: 8 mg/dL (ref 6–20)
CALCIUM: 9.8 mg/dL (ref 8.9–10.3)
CHLORIDE: 100 mmol/L — AB (ref 101–111)
CO2: 27 mmol/L (ref 22–32)
Creatinine, Ser: 0.67 mg/dL (ref 0.44–1.00)
GFR calc Af Amer: >60 mL/min (ref >60)
GFR calc non Af Amer: >60 mL/min (ref >60)
Glucose, Bld: 75 mg/dL (ref 65–99)
Potassium: 3.8 mmol/L (ref 3.5–5.1)
SODIUM: 137 mmol/L (ref 135–145)

## 2014-08-07 LAB — URINALYSIS, ROUTINE W REFLEX MICROSCOPIC
Bilirubin Urine: NEGATIVE
Glucose, UA: NEGATIVE mg/dL
Ketones, ur: 15 mg/dL — AB
Leukocytes, UA: NEGATIVE
Nitrite: NEGATIVE
PROTEIN: NEGATIVE mg/dL
SPECIFIC GRAVITY, URINE: 1.025 (ref 1.005–1.030)
UROBILINOGEN UA: 2 mg/dL — AB (ref 0.0–1.0)
pH: 7 (ref 5.0–8.0)

## 2014-08-07 LAB — URINE MICROSCOPIC-ADD ON

## 2014-08-07 MED ORDER — PROMETHAZINE HCL 25 MG PO TABS
12.5000 mg | ORAL_TABLET | Freq: Once | ORAL | Status: AC
  Administered 2014-08-07: 12.5 mg via ORAL
  Filled 2014-08-07: qty 1

## 2014-08-07 MED ORDER — PROMETHAZINE HCL 12.5 MG PO TABS: 12.5000 mg | ORAL_TABLET | Freq: Four times a day (QID) | ORAL | Status: DC | PRN

## 2014-08-07 NOTE — MAU Provider Note (Signed)
Chief Complaint: Near Syncope and Abdominal Cramping   First Provider Initiated Contact with Patient 08/07/14 1905     SUBJECTIVE HPI: Alexandra Nguyen is a 24 y.o. G1P0 at 7764w3d by LMP who presents to Maternity Admissions reporting an episode yesterday of feeling weak, dizzy and momentarily blacking out. Dizziness associated with brief palpitations and scotomata. She was in the mall and had not eaten or drank very much..Her friend who was with her during the episode confirms that shenot lose consciousness or fall. No previous syncopal episodes. [facility]/2014 for 3 weeks of abdominal pain and had ultrasound showing  viable IUP  GC culture was positive (also positive 07/19/2014) and she was given Rocephin and Zithromax on 08/02/2014. Partner not notified, not sexually active since. WP showed few clue cells. UA had > 80 ketones. She was prescribed Zofran and gets partial relief of nausea but feels it makes her dizzy. Requests something other than Zofran for N/V.  Past Medical History  Diagnosis Date  . Allergy    OB History  Gravida Para Term Preterm AB SAB TAB Ectopic Multiple Living  1             # Outcome Date GA Lbr Len/2nd Weight Sex Delivery Anes PTL Lv  1 Current              History reviewed. No pertinent past surgical history. History   Social History  . Marital Status: Single    Spouse Name: N/A  . Number of Children: N/A  . Years of Education: N/A   Occupational History  . Not on file.   Social History Main Topics  . Smoking status: Former Games developermoker  . Smokeless tobacco: Not on file  . Alcohol Use: No  . Drug Use: No  . Sexual Activity: Yes    Birth Control/ Protection: None   Other Topics Concern  . Not on file   Social History Narrative   No current facility-administered medications on file prior to encounter.   Current Outpatient Prescriptions on File Prior to Encounter  Medication Sig Dispense Refill  . ondansetron (ZOFRAN) 4 MG tablet Take 1  tablet (4 mg total) by mouth every 6 (six) hours. 20 tablet 0   Allergies  Allergen Reactions  . A & D [Dimethicone-Zinc Oxide-Vit A-D] Hives and Itching  . Fish Allergy Other (See Comments)    Eyes swell.    Review of Systems  Constitutional: Positive for malaise/fatigue. Negative for fever.  HENT: Negative for hearing loss.   Eyes: Negative for blurred vision, double vision and photophobia.       Has had intermittent scotomata  Respiratory: Negative for cough.   Cardiovascular: Negative for chest pain, palpitations and orthopnea.  Genitourinary: Negative for dysuria, urgency and frequency.       No vaginal bleeding  Musculoskeletal: Negative for falls.  Neurological: Positive for dizziness, tremors, sensory change and weakness. Negative for tingling, speech change, focal weakness, seizures, loss of consciousness and headaches.       No previous syncope    OBJECTIVE Blood pressure 118/78, pulse 109, temperature 98.3 F (36.8 C), temperature source Oral, resp. rate 16, height 5\' 8"  (1.727 m), last menstrual period 06/02/2014. GENERAL: Well-developed, well-nourished female in no acute distress.  HEART: RRR without murmur, rate 76 RESP: normal effort. CTA bilaterally GI: Abdomen soft, non-tender. FHR not heard by DT MS: Nontender, no edema NEURO: Alert and oriented, non-focal  LAB RESULTS Results for orders placed or performed during the hospital  encounter of 08/07/14 (from the past 24 hour(s))  Urinalysis, Routine w reflex microscopic     Status: Abnormal   Collection Time: 08/07/14  5:43 PM  Result Value Ref Range   Color, Urine YELLOW YELLOW   APPearance CLEAR CLEAR   Specific Gravity, Urine 1.025 1.005 - 1.030   pH 7.0 5.0 - 8.0   Glucose, UA NEGATIVE NEGATIVE mg/dL   Hgb urine dipstick SMALL (A) NEGATIVE   Bilirubin Urine NEGATIVE NEGATIVE   Ketones, ur 15 (A) NEGATIVE mg/dL   Protein, ur NEGATIVE NEGATIVE mg/dL   Urobilinogen, UA 2.0 (H) 0.0 - 1.0 mg/dL   Nitrite  NEGATIVE NEGATIVE   Leukocytes, UA NEGATIVE NEGATIVE  Urine microscopic-add on     Status: Abnormal   Collection Time: 08/07/14  5:43 PM  Result Value Ref Range   Squamous Epithelial / LPF FEW (A) RARE   WBC, UA 0-2 <3 WBC/hpf   RBC / HPF 3-6 <3 RBC/hpf   Bacteria, UA FEW (A) RARE   Urine-Other MUCOUS PRESENT   CBC     Status: None   Collection Time: 08/07/14  7:10 PM  Result Value Ref Range   WBC 10.3 4.0 - 10.5 K/uL   RBC 4.76 3.87 - 5.11 MIL/uL   Hemoglobin 13.3 12.0 - 15.0 g/dL   HCT 16.1 09.6 - 04.5 %   MCV 82.6 78.0 - 100.0 fL   MCH 27.9 26.0 - 34.0 pg   MCHC 33.8 30.0 - 36.0 g/dL   RDW 40.9 81.1 - 91.4 %   Platelets 252 150 - 400 K/uL    IMAGING US Ob Comp Less 14 Wks  07/29/2014   CLINICAL DATA:  Pelvic pain and vomiting for 3-4 weeks. First-trimester pregnancy.  EXAM: OBSTETRIC <14 WK [place] AND TRANSVAGINAL OB US  TECHNIQUE: Both transabdominal and transvaginal ultrasound examinations were performed for complete evaluation of the gestation as well as the maternal uterus, adnexal regions, and pelvic cul-de-sac. Transvaginal technique was performed to assess early pregnancy.  COMPARISON:  None.  FINDINGS: Intrauterine gestational sac: Visualized/normal in shape.  Yolk sac:  Present and normal  Embryo:  Present  Cardiac Activity: Present  Heart Rate: 165  bpm  CRL:  18  mm   8 w   2 d                  [place] EDC: 03/08/2015  Maternal uterus/adnexae: Small simple appearing free pelvic fluid. The right ovary is unremarkable. The left ovary contains a corpus luteum cyst and a 5 cm simple appearing cyst. Color flow is seen in the left ovary around the corpus luteum.  IMPRESSION: 1. Single living intrauterine gestation at 8 weeks 2 days. 2. 5 cm left ovarian cyst. 3. Small simple pelvic fluid.   Electronically Signed   By: Marnee Spring M.D.   On: 07/29/2014 16:03   US Ob Transvaginal  07/29/2014   CLINICAL DATA:  Pelvic pain and vomiting for 3-4 weeks. First-trimester pregnancy.  EXAM:  OBSTETRIC <14 WK [place] AND TRANSVAGINAL OB US  TECHNIQUE: Both transabdominal and transvaginal ultrasound examinations were performed for complete evaluation of the gestation as well as the maternal uterus, adnexal regions, and pelvic cul-de-sac. Transvaginal technique was performed to assess early pregnancy.  COMPARISON:  None.  FINDINGS: Intrauterine gestational sac: Visualized/normal in shape.  Yolk sac:  Present and normal  Embryo:  Present  Cardiac Activity: Present  Heart Rate: 165  bpm  CRL:  18  mm   8 w  2 d                  US EDC: 03/08/2015  Maternal uterus/adnexae: Small simple appearing free pelvic fluid. The right ovary is unremarkable. The left ovary contains a corpus luteum cyst and a 5 cm simple appearing cyst. Color flow is seen in the left ovary around the corpus luteum.  IMPRESSION: 1. Single living intrauterine gestation at 8 weeks 2 days. 2. 5 cm left ovarian cyst. 3. Small simple pelvic fluid.   Electronically Signed   By: Marnee SpringJonathon  Watts M.D.   On: 07/29/2014 16:03    MAU COURSE Orthostatic VS for the past 24 hrs:  BP- Lying Pulse- Lying BP- Sitting Pulse- Sitting BP- Standing at 0 minutes Pulse- Standing at 0 minutes  08/07/14 1905 108/61 mmHg 80 117/76 mmHg 92 116/74 mmHg 107  Bedside US>  Drinking and retaining liquids. Phenergan 12.5mg  given with relief of nausea Brief bedside US (by request)> normal FHR   ASSESSMENT 1. Near syncope   2. Dehydration, mild   3. Nausea and vomiting during pregnancy prior to [redacted] weeks gestation   4. Gonorrhea affecting pregnancy in first trimester   G1 at 5028w3d  PLAN Discharge home in stable condition. Advised to notify partner of her positive GC Advised to get wet prep with test of cure at new OB visit List of providers and information on MPW given   Medication List    TAKE these medications        ondansetron 4 MG tablet  Commonly known as:  ZOFRAN  Take 1 tablet (4 mg total) by mouth every 6 (six) hours.     promethazine 12.5  MG tablet  Commonly known as:  PHENERGAN  Take 1 tablet (12.5 mg total) by mouth every 6 (six) hours as needed for nausea or vomiting.       Follow-up Information    [facility] [place]. Schedule an appointment as soon as possible for a visit in 1 week.   Specialty:  Obstetrics and Gynecology   Contact information:   [address], [address] [place] [id number] [phone]       Danae OrleansDeirdre C Yecheskel Kurek, CNM 08/07/2014  7:07 PM

## 2014-08-07 NOTE — Discharge Instructions (Signed)
Dehydration, Adult °Dehydration is when you lose more fluids from the body than you take in. Vital organs like the kidneys, brain, and heart cannot function without a proper amount of fluids and salt. Any loss of fluids from the body can cause dehydration.  °CAUSES  °· Vomiting. °· Diarrhea. °· Excessive sweating. °· Excessive urine output. °· Fever. °SYMPTOMS  °Mild dehydration °· Thirst. °· Dry lips. °· Slightly dry mouth. °Moderate dehydration °· Very dry mouth. °· Sunken eyes. °· Skin does not bounce back quickly when lightly pinched and released. °· Dark urine and decreased urine production. °· Decreased tear production. °· Headache. °Severe dehydration °· Very dry mouth. °· Extreme thirst. °· Rapid, weak pulse (more than 100 beats per minute at rest). °· Cold hands and feet. °· Not able to sweat in spite of heat and temperature. °· Rapid breathing. °· Blue lips. °· Confusion and lethargy. °· Difficulty being awakened. °· Minimal urine production. °· No tears. °DIAGNOSIS  °Your caregiver will diagnose dehydration based on your symptoms and your exam. Blood and urine tests will help confirm the diagnosis. The diagnostic evaluation should also identify the cause of dehydration. °TREATMENT  °Treatment of mild or moderate dehydration can often be done at home by increasing the amount of fluids that you drink. It is best to drink small amounts of fluid more often. Drinking too much at one time can make vomiting worse. Refer to the home care instructions below. °Severe dehydration needs to be treated at the hospital where you will probably be given intravenous (IV) fluids that contain water and electrolytes. °HOME CARE INSTRUCTIONS  °· Ask your caregiver about specific rehydration instructions. °· Drink enough fluids to keep your urine clear or pale yellow. °· Drink small amounts frequently if you have nausea and vomiting. °· Eat as you normally do. °· Avoid: °¨ Foods or drinks high in sugar. °¨ Carbonated  drinks. °¨ Juice. °¨ Extremely hot or cold fluids. °¨ Drinks with caffeine. °¨ Fatty, greasy foods. °¨ Alcohol. °¨ Tobacco. °¨ Overeating. °¨ Gelatin desserts. °· Wash your hands well to avoid spreading bacteria and viruses. °· Only take over-the-counter or prescription medicines for pain, discomfort, or fever as directed by your caregiver. °· Ask your caregiver if you should continue all prescribed and over-the-counter medicines. °· Keep all follow-up appointments with your caregiver. °SEEK MEDICAL CARE IF: °· You have abdominal pain and it increases or stays in one area (localizes). °· You have a rash, stiff neck, or severe headache. °· You are irritable, sleepy, or difficult to awaken. °· You are weak, dizzy, or extremely thirsty. °SEEK IMMEDIATE MEDICAL CARE IF:  °· You are unable to keep fluids down or you get worse despite treatment. °· You have frequent episodes of vomiting or diarrhea. °· You have blood or green matter (bile) in your vomit. °· You have blood in your stool or your stool looks black and tarry. °· You have not urinated in 6 to 8 hours, or you have only urinated a small amount of very dark urine. °· You have a fever. °· You faint. °MAKE SURE YOU:  °· Understand these instructions. °· Will watch your condition. °· Will get help right away if you are not doing well or get worse. °Document Released: 03/12/2005 Document Revised: 06/04/2011 Document Reviewed: 10/30/2010 °ExitCare® Patient Information ©2015 [employer]. This information is not intended to replace advice given to you by your health care provider. Make sure you discuss any questions you have with your health care   provider. Near-Syncope Near-syncope (commonly known as near fainting) is sudden weakness, dizziness, or feeling like you might pass out. During an episode of near-syncope, you may also develop pale skin, have tunnel vision, or feel sick to your stomach (nauseous). Near-syncope may occur when getting up after sitting or  while standing for a long time. It is caused by a sudden decrease in blood flow to the brain. This decrease can result from various causes or triggers, most of which are not serious. However, because near-syncope can sometimes be a sign of something serious, a medical evaluation is required. The specific cause is often not determined. HOME CARE INSTRUCTIONS  Monitor your condition for any changes. The following actions may help to alleviate any discomfort you are experiencing:  Have someone stay with you until you feel stable.  Lie down right away and prop your feet up if you start feeling like you might faint. Breathe deeply and steadily. Wait until all the symptoms have passed. Most of these episodes last only a few minutes. You may feel tired for several hours.   Drink enough fluids to keep your urine clear or pale yellow.   If you are taking blood pressure or heart medicine, get up slowly when seated or lying down. Take several minutes to sit and then stand. This can reduce dizziness.  Follow up with your health care provider as directed. SEEK IMMEDIATE MEDICAL CARE IF:   You have a severe headache.   You have unusual pain in the chest, abdomen, or back.   You are bleeding from the mouth or rectum, or you have black or tarry stool.   You have an irregular or very fast heartbeat.   You have repeated fainting or have seizure-like jerking during an episode.   You faint when sitting or lying down.   You have confusion.   You have difficulty walking.   You have severe weakness.   You have vision problems.  MAKE SURE YOU:   Understand these instructions.  Will watch your condition.  Will get help right away if you are not doing well or get worse. Document Released: 03/12/2005 Document Revised: 03/17/2013 Document Reviewed: 08/15/2012 [facility] Patient Information 2015 [employer] [place]. This information is not intended to replace advice given to you by your health  care provider. Make sure you discuss any questions you have with your health care provider. [facility]    [facility]  [phone]     Phone: [phone]          [facility]                      [facility]  @[place]     Phone: [phone]  Phone: [phone]         [facility]     Phone: [phone]  Phone: [phone]           [facility] [place]                hone: [phone]  Phone: [phone]         [facility] [address] Franco[address] Ceo[facility]      Phone: [phone]  Phone: [phone]         [facility]  Phone: [phone]  [facility]   [address]:[phone]    [facility] [place])          Phone: [phone] [facility]   Phone: [phone]    ________________________________________     To schedule your Maternity Eligibility Appointment, please call [phone].  When you arrive for your appointment you must bring the following items or information listed below.  Your appointment will be rescheduled if you do not have these items or are 15 minutes late. If currently receiving Medicaid, you MUST bring: 1. Medicaid Card 2. Social Security Card 3. Picture ID 4. Proof of Pregnancy 5. Verification of current address if the address on Medicaid card is incorrect "postmarked mail" If not receiving Medicaid, you MUST bring: 1. Social Security Card 2. Picture ID 3. Birth Certificate (if available) Passport or *Green Card 4. Proof of Pregnancy 5. Verification of current address "postmarked mail" for each income presented. 6. Verification of insurance coverage, if any 7. Check stubs from each employer for the previous month (if unable to present check stub  for each week, we will accept check stub for the first and last week  ill the same month.) If you can't locate check stubs, you must bring a letter from the employer(s) and it must have the following information on letterhead, typed, in English: o name of company o company telephone number o how long been with the company, if less than one month o how much person earns per hour o how many hours per week work o the gross pay the person earned for the previous month If you are 22105 years old or less, you do not have to bring proof of income unless you work or live with the father of the baby and at that time we will need proof of income from you and/or the father of the baby. Green Card recipients are eligible for Medicaid for Pregnant Women (MPW)

## 2014-08-07 NOTE — MAU Note (Signed)
Patient presents to MAU c/o of dizziness/near fainting, abdominal cramping. She reports that the dizziness started yesterday when she was at the mall, she began to get hot and blacked out. The cramping has been going for about 2 weeks now. She reports that she still feels dizzy which is why she came to MAU. She reports no vaginal bleeding and a thick white discharge and no pain or burning with urination.

## 2014-08-10 ENCOUNTER — Encounter (HOSPITAL_BASED_OUTPATIENT_CLINIC_OR_DEPARTMENT_OTHER): Payer: Self-pay | Admitting: Emergency Medicine

## 2014-08-10 ENCOUNTER — Emergency Department (HOSPITAL_BASED_OUTPATIENT_CLINIC_OR_DEPARTMENT_OTHER)
Admission: EM | Admit: 2014-08-10 | Discharge: 2014-08-10 | Disposition: A | Discharge status: Home / Self Care | Payer: BLUE CROSS/BLUE SHIELD | Attending: Emergency Medicine | Admitting: Emergency Medicine

## 2014-08-10 DIAGNOSIS — Z87891 Personal history of nicotine dependence: Secondary | ICD-10-CM | POA: Insufficient documentation

## 2014-08-10 DIAGNOSIS — Z3A10 10 weeks gestation of pregnancy: Secondary | ICD-10-CM | POA: Insufficient documentation

## 2014-08-10 DIAGNOSIS — R109 Unspecified abdominal pain: Secondary | ICD-10-CM | POA: Diagnosis not present

## 2014-08-10 DIAGNOSIS — O9989 Other specified diseases and conditions complicating pregnancy, childbirth and the puerperium: Secondary | ICD-10-CM | POA: Insufficient documentation

## 2014-08-10 DIAGNOSIS — O210 Mild hyperemesis gravidarum: Secondary | ICD-10-CM | POA: Insufficient documentation

## 2014-08-10 DIAGNOSIS — O219 Vomiting of pregnancy, unspecified: Secondary | ICD-10-CM

## 2014-08-10 MED ORDER — PROMETHAZINE HCL 25 MG RE SUPP: 25.0000 mg | Freq: Four times a day (QID) | RECTAL | Status: DC | PRN

## 2014-08-10 MED ORDER — PROMETHAZINE HCL 25 MG RE SUPP
25.0000 mg | Freq: Once | RECTAL | Status: DC
  Filled 2014-08-10: qty 1

## 2014-08-10 NOTE — ED Provider Notes (Signed)
CSN: [id number]     Arrival date & time 08/10/14  [id number] History   First MD Initiated Contact with Patient 08/10/14 [phone]     Chief Complaint  Patient presents with  . Emesis During Pregnancy     (Consider location/radiation/quality/duration/timing/severity/associated sxs/prior Treatment) Patient is a 24 y.o. female presenting with vomiting.  Emesis Severity:  Severe Duration: "I guess ever since I found out I was pregnant" This was apparently about a month ago. Timing:  Intermittent Quality:  Stomach contents Progression:  Unchanged Chronicity:  New Relieved by:  Nothing Ineffective treatments: zofran, phenergan. Associated symptoms: abdominal pain (when vomiting)   Associated symptoms: no diarrhea and no fever     Past Medical History  Diagnosis Date  . Allergy    No past surgical history on file. No family history on file. History  Substance Use Topics  . Smoking status: Former Games developermoker  . Smokeless tobacco: Not on file  . Alcohol Use: No   OB History    Gravida Para Term Preterm AB TAB SAB Ectopic Multiple Living   1              Review of Systems  Gastrointestinal: Positive for vomiting and abdominal pain (when vomiting). Negative for diarrhea.  All other systems reviewed and are negative.     Allergies  A & d and Fish allergy  Home Medications   Prior to Admission medications   Medication Sig Start Date End Date Taking? Authorizing Provider  ondansetron (ZOFRAN) 4 MG tablet Take 1 tablet (4 mg total) by mouth every 6 (six) hours. 07/29/14   Gilda Creasehristopher J Pollina, MD  promethazine (PHENERGAN) 12.5 MG tablet Take 1 tablet (12.5 mg total) by mouth every 6 (six) hours as needed for nausea or vomiting. 08/07/14   Deirdre C Poe, CNM   BP 117/84 mmHg  Pulse 94  Temp(Src) 97.8 F (36.6 C) (Oral)  Resp 16  Ht 5\' 8"  (1.727 m)  Wt 194 lb 11.2 oz (88.315 kg)  BMI 29.61 kg/m2  SpO2 100%  LMP 06/02/2014 Physical Exam  Constitutional: She is oriented to person,  place, and time. She appears well-developed and well-nourished. No distress.  HENT:  Head: Normocephalic and atraumatic.  Mouth/Throat: Oropharynx is clear and moist and mucous membranes are normal. Mucous membranes are not dry.  Eyes: Conjunctivae are normal. No scleral icterus.  Neck: Neck supple.  Cardiovascular: Normal rate and intact distal pulses.   Pulmonary/Chest: Effort normal. No stridor. No respiratory distress.  Abdominal: Soft. Normal appearance. She exhibits no distension. There is no tenderness. There is no rebound and no guarding.  Neurological: She is alert and oriented to person, place, and time.  Skin: Skin is warm and dry. No rash noted.  Psychiatric: She has a normal mood and affect. Her behavior is normal.  Nursing note and vitals reviewed.   ED Course  Procedures (including critical care time) Labs Review Labs Reviewed - No data to display  Imaging Review No results found.   EKG Interpretation None      MDM   Final diagnoses:  Nausea and vomiting during pregnancy prior to [redacted] weeks gestation    24 yo female G1P0 presenting with nausea and vomiting. It was very difficult to obtain a clear history for patient.  She was hostile from the very beginning of our interview.  She refused to provide many historical details.  Therefore, much of history was obtained from her chart.  It appears that she has been seen for nausea  and vomiting a few times recently.  Once in the ED, at which time she got Zofran.  [facility]  She reports she was given phenergan but not IV fluids.  Per notes, she improved with phenergan and was discharged with a prescription.  She has not filled that prescription.  She also states she is not going to follow up with an obstetrician because she plans on getting an elective abortion in 3 days.    On exam, she has a soft nontender abdomen.  She has moist mucous membranes and stable vitals.  She is not clinically dehydrated.  I  suspect this is why she did not require IV fluids and why she does not require them today.  Will try PR phenergan and provide her with a prescription for such.  Have also provided verbal and will provide written material regarding vomiting in pregnancy.      Blake DivineJohn Simrit Gohlke, MD 08/10/14 [phone]

## 2014-08-10 NOTE — ED Notes (Addendum)
Pt is refusing to take phenergan. Pt states "I am already pissed off by the Dr. Ilsa Ihaya'll are not listening to me and I know my body. All of them drugs are not working. They make me feel worse." This RN attentively listened and asked pt when was the last time she vomited. "Pt states a while ago but yall are the Drs. Not me so yall tell me what needs to be done." This RN informed the pt that we will do what we can during her process. Pt covers her head with sheet.

## 2014-08-10 NOTE — Discharge Instructions (Signed)

## 2014-08-10 NOTE — ED Notes (Signed)
EDP made aware that we don't carry the medication ordered.

## 2014-08-10 NOTE — ED Notes (Signed)
Pt approximately [redacted] weeks pregnant.  Pt has been vomiting since the beginning of the pregnancy.  Pt states she has been noticing blood in her emesis after vomiting for a while for two days.  Nausea medication has not been helping.

## 2014-08-10 NOTE — ED Notes (Signed)
MD at bedside. 

## 2014-10-11 ENCOUNTER — Other Ambulatory Visit: Payer: Self-pay

## 2014-10-11 LAB — OB RESULTS CONSOLE HEPATITIS B SURFACE ANTIGEN: Hepatitis B Surface Ag: NEGATIVE

## 2014-10-11 LAB — OB RESULTS CONSOLE ABO/RH: RH Type: POSITIVE

## 2014-10-11 LAB — OB RESULTS CONSOLE RUBELLA ANTIBODY, IGM: Rubella: IMMUNE

## 2014-10-13 LAB — CYTOLOGY - PAP

## 2015-02-10 ENCOUNTER — Other Ambulatory Visit: Payer: Self-pay | Admitting: Obstetrics

## 2015-02-10 LAB — OB RESULTS CONSOLE GBS: STREP GROUP B AG: POSITIVE

## 2015-02-28 ENCOUNTER — Inpatient Hospital Stay (HOSPITAL_COMMUNITY)
Admission: AD | Admit: 2015-02-28 | Discharge: 2015-03-02 | DRG: 775 | Disposition: A | Discharge status: Home / Self Care | Payer: BLUE CROSS/BLUE SHIELD | Source: Ambulatory Visit | Attending: Obstetrics and Gynecology | Admitting: Obstetrics and Gynecology

## 2015-02-28 ENCOUNTER — Inpatient Hospital Stay (HOSPITAL_COMMUNITY): Payer: BLUE CROSS/BLUE SHIELD | Admitting: Anesthesiology

## 2015-02-28 ENCOUNTER — Encounter (HOSPITAL_COMMUNITY): Payer: Self-pay | Admitting: *Deleted

## 2015-02-28 DIAGNOSIS — O9902 Anemia complicating childbirth: Secondary | ICD-10-CM | POA: Diagnosis present

## 2015-02-28 DIAGNOSIS — D649 Anemia, unspecified: Secondary | ICD-10-CM | POA: Diagnosis present

## 2015-02-28 DIAGNOSIS — O99214 Obesity complicating childbirth: Secondary | ICD-10-CM | POA: Diagnosis present

## 2015-02-28 DIAGNOSIS — K219 Gastro-esophageal reflux disease without esophagitis: Secondary | ICD-10-CM | POA: Diagnosis present

## 2015-02-28 DIAGNOSIS — O9962 Diseases of the digestive system complicating childbirth: Secondary | ICD-10-CM | POA: Diagnosis present

## 2015-02-28 DIAGNOSIS — Z87891 Personal history of nicotine dependence: Secondary | ICD-10-CM | POA: Diagnosis not present

## 2015-02-28 DIAGNOSIS — O4292 Full-term premature rupture of membranes, unspecified as to length of time between rupture and onset of labor: Secondary | ICD-10-CM

## 2015-02-28 DIAGNOSIS — IMO0001 Reserved for inherently not codable concepts without codable children: Secondary | ICD-10-CM

## 2015-02-28 DIAGNOSIS — Z3A38 38 weeks gestation of pregnancy: Secondary | ICD-10-CM

## 2015-02-28 DIAGNOSIS — Z6839 Body mass index (BMI) 39.0-39.9, adult: Secondary | ICD-10-CM | POA: Diagnosis not present

## 2015-02-28 HISTORY — DX: Anemia, unspecified: D64.9

## 2015-02-28 LAB — TYPE AND SCREEN
ABO/RH(D): B POS
Antibody Screen: NEGATIVE

## 2015-02-28 LAB — CBC
HCT: 33.6 % — ABNORMAL LOW (ref 36.0–46.0)
Hemoglobin: 11 g/dL — ABNORMAL LOW (ref 12.0–15.0)
MCH: 27.7 pg (ref 26.0–34.0)
MCHC: 32.7 g/dL (ref 30.0–36.0)
MCV: 84.6 fL (ref 78.0–100.0)
PLATELETS: 218 10*3/uL (ref 150–400)
RBC: 3.97 MIL/uL (ref 3.87–5.11)
RDW: 14 % (ref 11.5–15.5)
WBC: 11.8 10*3/uL — AB (ref 4.0–10.5)

## 2015-02-28 LAB — POCT FERN TEST

## 2015-02-28 LAB — ABO/RH: ABO/RH(D): B POS

## 2015-02-28 MED ORDER — LACTATED RINGERS IV SOLN
500.0000 mL | INTRAVENOUS | Status: DC | PRN
  Administered 2015-03-01: 500 mL via INTRAVENOUS

## 2015-02-28 MED ORDER — FENTANYL 2.5 MCG/ML BUPIVACAINE 1/10 % EPIDURAL INFUSION (WH - ANES)
INTRAMUSCULAR | Status: AC
  Administered 2015-03-01: 14 mL/h via EPIDURAL
  Filled 2015-02-28: qty 125

## 2015-02-28 MED ORDER — ACETAMINOPHEN 325 MG PO TABS: 650.0000 mg | ORAL_TABLET | ORAL | Status: DC | PRN

## 2015-02-28 MED ORDER — OXYTOCIN BOLUS FROM INFUSION: 500.0000 mL | INTRAVENOUS | Status: DC

## 2015-02-28 MED ORDER — CITRIC ACID-SODIUM CITRATE 334-500 MG/5ML PO SOLN
30.0000 mL | ORAL | Status: DC | PRN
Start: 2015-02-28 — End: 2015-03-01

## 2015-02-28 MED ORDER — FENTANYL 2.5 MCG/ML BUPIVACAINE 1/10 % EPIDURAL INFUSION (WH - ANES)
14.0000 mL/h | INTRAMUSCULAR | Status: DC | PRN
  Administered 2015-03-01 (×2): 14 mL/h via EPIDURAL

## 2015-02-28 MED ORDER — OXYCODONE-ACETAMINOPHEN 5-325 MG PO TABS: 2.0000 | ORAL_TABLET | ORAL | Status: DC | PRN

## 2015-02-28 MED ORDER — FLEET ENEMA 7-19 GM/118ML RE ENEM: 1.0000 | ENEMA | RECTAL | Status: DC | PRN

## 2015-02-28 MED ORDER — LIDOCAINE HCL (PF) 1 % IJ SOLN
30.0000 mL | INTRAMUSCULAR | Status: DC | PRN
Start: 2015-02-28 — End: 2015-03-01
  Filled 2015-02-28: qty 30

## 2015-02-28 MED ORDER — ONDANSETRON HCL 4 MG/2ML IJ SOLN
4.0000 mg | Freq: Four times a day (QID) | INTRAMUSCULAR | Status: DC | PRN
  Administered 2015-02-28: 4 mg via INTRAVENOUS
  Filled 2015-02-28: qty 2

## 2015-02-28 MED ORDER — PENICILLIN G POTASSIUM 5000000 UNITS IJ SOLR
2.5000 10*6.[IU] | INTRAVENOUS | Status: DC
  Administered 2015-02-28 – 2015-03-01 (×2): 2.5 10*6.[IU] via INTRAVENOUS
  Filled 2015-02-28 (×6): qty 2.5

## 2015-02-28 MED ORDER — PENICILLIN G POTASSIUM 5000000 UNITS IJ SOLR
5.0000 10*6.[IU] | Freq: Once | INTRAVENOUS | Status: AC
  Administered 2015-02-28: 5 10*6.[IU] via INTRAVENOUS
  Filled 2015-02-28: qty 5

## 2015-02-28 MED ORDER — BUTORPHANOL TARTRATE 1 MG/ML IJ SOLN
1.0000 mg | INTRAMUSCULAR | Status: DC | PRN
  Administered 2015-02-28: 1 mg via INTRAVENOUS
  Filled 2015-02-28: qty 1

## 2015-02-28 MED ORDER — TERBUTALINE SULFATE 1 MG/ML IJ SOLN: 0.2500 mg | Freq: Once | INTRAMUSCULAR | Status: DC | PRN

## 2015-02-28 MED ORDER — OXYTOCIN 40 UNITS IN LACTATED RINGERS INFUSION - SIMPLE MED
62.5000 mL/h | INTRAVENOUS | Status: DC
  Filled 2015-02-28: qty 1000

## 2015-02-28 MED ORDER — OXYTOCIN 40 UNITS IN LACTATED RINGERS INFUSION - SIMPLE MED
1.0000 m[IU]/min | INTRAVENOUS | Status: DC
  Administered 2015-02-28: 2 m[IU]/min via INTRAVENOUS

## 2015-02-28 MED ORDER — PHENYLEPHRINE 40 MCG/ML (10ML) SYRINGE FOR IV PUSH (FOR BLOOD PRESSURE SUPPORT)
PREFILLED_SYRINGE | INTRAVENOUS | Status: DC
Start: 2015-02-28 — End: 2015-03-01
  Filled 2015-02-28: qty 20

## 2015-02-28 MED ORDER — PHENYLEPHRINE 40 MCG/ML (10ML) SYRINGE FOR IV PUSH (FOR BLOOD PRESSURE SUPPORT): 80.0000 ug | PREFILLED_SYRINGE | INTRAVENOUS | Status: DC | PRN

## 2015-02-28 MED ORDER — OXYCODONE-ACETAMINOPHEN 5-325 MG PO TABS: 1.0000 | ORAL_TABLET | ORAL | Status: DC | PRN

## 2015-02-28 MED ORDER — DIPHENHYDRAMINE HCL 50 MG/ML IJ SOLN: 12.5000 mg | INTRAMUSCULAR | Status: DC | PRN

## 2015-02-28 MED ORDER — LACTATED RINGERS IV SOLN
INTRAVENOUS | Status: DC
  Administered 2015-02-28: 16:00:00 via INTRAVENOUS

## 2015-02-28 MED ORDER — EPHEDRINE 5 MG/ML INJ: 10.0000 mg | INTRAVENOUS | Status: DC | PRN

## 2015-02-28 NOTE — Anesthesia Preprocedure Evaluation (Signed)
Anesthesia Evaluation  Patient identified by MRN, date of birth, ID band Patient awake    Reviewed: Allergy & Precautions, Patient's Chart, lab work & pertinent test results  Airway Mallampati: III  TM Distance: >3 FB Neck ROM: Full    Dental no notable dental hx. (+) Teeth Intact   Pulmonary former smoker,    Pulmonary exam normal breath sounds clear to auscultation       Cardiovascular negative cardio ROS Normal cardiovascular exam Rhythm:Regular Rate:Normal     Neuro/Psych negative neurological ROS  negative psych ROS   GI/Hepatic Neg liver ROS, GERD  Medicated and Controlled,  Endo/Other  Morbid obesity  Renal/GU negative Renal ROS  negative genitourinary   Musculoskeletal negative musculoskeletal ROS (+)   Abdominal (+) + obese,   Peds  Hematology  (+) anemia ,   Anesthesia Other Findings   Reproductive/Obstetrics (+) Pregnancy                             Anesthesia Physical Anesthesia Plan  ASA: III  Anesthesia Plan: Epidural   Post-op Pain Management:    Induction:   Airway Management Planned: Natural Airway  Additional Equipment:   Intra-op Plan:   Post-operative Plan:   Informed Consent: I have reviewed the patients History and Physical, chart, labs and discussed the procedure including the risks, benefits and alternatives for the proposed anesthesia with the patient or authorized representative who has indicated his/her understanding and acceptance.     Plan Discussed with: Anesthesiologist  Anesthesia Plan Comments:         Anesthesia Quick Evaluation

## 2015-02-28 NOTE — MAU Provider Note (Signed)
S:  [name] is a 24 y.o. female G1P0 at 5845w5d presenting to MAU with ? ROM. The patient started leaking fluid around 11:00 this afternoon. She has continued to leak clear fluid throughout the afternoon. She denies bleeding. + fetal movement.   +GBS  O:  GENERAL: Well-developed, well-nourished female in no acute distress.  LUNGS: Effort normal SKIN: Warm, dry and without erythema PSYCH: Normal mood and affect  Filed Vitals:   02/28/15 1414  BP: 133/82  Pulse: 104  Temp: 98.7 F (37.1 C)  Resp: 16  Height: 5\' 8"  (1.727 m)  Weight: 165 lb (74.844 kg)   MDM:   Speculum exam: Vagina - Large amount of clear fluid pooling in the vagina.  Cervix - Dilation: 2 Effacement (%): 90 Station: -3 Presentation: Vertex Exam by:: Alexandra Nguyen CoN Chaperone present for exam.  A:  ROM at term gestation   P:  Admit per Dr. Tenny Crawoss.    Duane LopeJennifer I Izela Altier, NP 02/28/2015 3:10 PM

## 2015-02-28 NOTE — MAU Note (Signed)
C/o ?SROM @ 1145 today;

## 2015-02-28 NOTE — Progress Notes (Signed)
Tenny Crawoss, MD aware of latest SVE and contraction pattern, will continue to assess.

## 2015-03-01 ENCOUNTER — Encounter (HOSPITAL_COMMUNITY): Payer: Self-pay

## 2015-03-01 LAB — RPR: RPR: NONREACTIVE

## 2015-03-01 MED ORDER — METHYLERGONOVINE MALEATE 0.2 MG PO TABS: 0.2000 mg | ORAL_TABLET | ORAL | Status: DC | PRN

## 2015-03-01 MED ORDER — WITCH HAZEL-GLYCERIN EX PADS: 1.0000 "application " | MEDICATED_PAD | CUTANEOUS | Status: DC | PRN

## 2015-03-01 MED ORDER — OXYCODONE-ACETAMINOPHEN 5-325 MG PO TABS: 2.0000 | ORAL_TABLET | ORAL | Status: DC | PRN

## 2015-03-01 MED ORDER — SIMETHICONE 80 MG PO CHEW: 80.0000 mg | CHEWABLE_TABLET | ORAL | Status: DC | PRN

## 2015-03-01 MED ORDER — IBUPROFEN 600 MG PO TABS
600.0000 mg | ORAL_TABLET | Freq: Four times a day (QID) | ORAL | Status: DC
  Administered 2015-03-01 – 2015-03-02 (×5): 600 mg via ORAL
  Filled 2015-03-01 (×5): qty 1

## 2015-03-01 MED ORDER — DIPHENHYDRAMINE HCL 25 MG PO CAPS: 25.0000 mg | ORAL_CAPSULE | Freq: Four times a day (QID) | ORAL | Status: DC | PRN

## 2015-03-01 MED ORDER — BENZOCAINE-MENTHOL 20-0.5 % EX AERO
1.0000 "application " | INHALATION_SPRAY | CUTANEOUS | Status: DC | PRN
  Filled 2015-03-01 (×2): qty 56

## 2015-03-01 MED ORDER — SENNOSIDES-DOCUSATE SODIUM 8.6-50 MG PO TABS
2.0000 | ORAL_TABLET | ORAL | Status: DC
  Administered 2015-03-01: 2 via ORAL
  Filled 2015-03-01: qty 2

## 2015-03-01 MED ORDER — DIBUCAINE 1 % RE OINT
1.0000 "application " | TOPICAL_OINTMENT | RECTAL | Status: DC | PRN
  Filled 2015-03-01: qty 28

## 2015-03-01 MED ORDER — ACETAMINOPHEN 325 MG PO TABS
650.0000 mg | ORAL_TABLET | ORAL | Status: DC | PRN
  Administered 2015-03-02: 650 mg via ORAL
  Filled 2015-03-01: qty 2

## 2015-03-01 MED ORDER — LANOLIN HYDROUS EX OINT: TOPICAL_OINTMENT | CUTANEOUS | Status: DC | PRN

## 2015-03-01 MED ORDER — ONDANSETRON HCL 4 MG PO TABS: 4.0000 mg | ORAL_TABLET | ORAL | Status: DC | PRN

## 2015-03-01 MED ORDER — LIDOCAINE HCL (PF) 1 % IJ SOLN
INTRAMUSCULAR | Status: DC | PRN
  Administered 2015-03-01 (×2): 4 mL via EPIDURAL

## 2015-03-01 MED ORDER — ONDANSETRON HCL 4 MG/2ML IJ SOLN: 4.0000 mg | INTRAMUSCULAR | Status: DC | PRN

## 2015-03-01 MED ORDER — METHYLERGONOVINE MALEATE 0.2 MG/ML IJ SOLN
0.2000 mg | INTRAMUSCULAR | Status: DC | PRN
Start: 2015-03-01 — End: 2015-03-02

## 2015-03-01 MED ORDER — TETANUS-DIPHTH-ACELL PERTUSSIS 5-2.5-18.5 LF-MCG/0.5 IM SUSP: 0.5000 mL | Freq: Once | INTRAMUSCULAR | Status: DC

## 2015-03-01 MED ORDER — ZOLPIDEM TARTRATE 5 MG PO TABS: 5.0000 mg | ORAL_TABLET | Freq: Every evening | ORAL | Status: DC | PRN

## 2015-03-01 MED ORDER — OXYCODONE-ACETAMINOPHEN 5-325 MG PO TABS: 1.0000 | ORAL_TABLET | ORAL | Status: DC | PRN

## 2015-03-01 NOTE — Progress Notes (Signed)
Tenny Crawoss, MD aware of current SVE.

## 2015-03-01 NOTE — Lactation Note (Signed)
This note was copied from the chart of [place]. Lactation Consultation Note  Patient Name: Alexandra Nguyen [id number] Date: 03/01/2015 Reason for consult: Initial assessment 25 hours old, initially had low CBG/s and resolved quickly yesterday. 3% weight loss, Bili check at 24 hours 4.3. Breast feeding range 10 -20 mins, EBM with a spoon x2 - 2 ml. 7 void , 5 stool . Latch score - 5-7-7-7-8-9-9-8-8. Per mom baby really has been feeding well. And I am feeling  More comfortable to latch. Mom denies soreness with latching. Sore nipple and engorgement prevention and tx.  Per mom mentioned she is also feeling comfortable with hand expressing and has a hand pump( Medela). Also  has the DEBP kit. Per mom active with WIC.  Mom was asking questions about going back to school in January. LC mentioned to mom to [facility] around 2 1/2 weeks  To check on a DEBP.  Mother informed of post-discharge support and given phone number to the lactation department, including services for phone  call assistance; out-patient appointments; and breastfeeding support group. List of other breastfeeding resources in the community  given in the handout. Encouraged mother to call for problems or concerns related to breastfeeding.  Maternal Data Has patient been taught Hand Expression?: Yes (63m earlier , 1 ml after attemlte to breast feed ) Does the patient have breastfeeding experience prior to this delivery?: No  Feeding Feeding Type: Breast Milk Length of feed: 4 min (consistent sucking with swallows )  LATCH Score/Interventions Latch: Grasps breast easily, tongue down, lips flanged, rhythmical sucking. Intervention(s): Skin to skin;Teach feeding cues;Waking techniques Intervention(s): Adjust position;Assist with latch;Breast massage;Breast compression  Audible Swallowing: None (few stromg sucks ) Intervention(s): Skin to skin;Hand expression  Type of Nipple: Everted at rest and after  stimulation  Comfort (Breast/Nipple): Soft / non-tender     Hold (Positioning): Assistance needed to correctly position infant at breast and maintain latch. Intervention(s): Breastfeeding basics reviewed  LATCH Score: 7  Lactation Tools Discussed/Used WIC Program: Yes (per mom [place]/ attended the BF class )   Consult Status Consult Status: Follow-up Date: 03/01/15 Follow-up type: In-patient    IMyer Haff12/08/2014, 11:58 AM

## 2015-03-01 NOTE — Lactation Note (Signed)
This note was copied from the chart of Alexandra Nguyen. Lactation Consultation Note  Patient Name: Alexandra Nguyen [id number]'s Date: 03/01/2015 Reason for consult: Follow-up assessment Mom called for help latching baby. Upon entry mom stated that baby would not stay on the breast so she spoon fed the colostrum that she had previously expressed. She will call LC at next feeding.    Maternal Data Has patient been taught Hand Expression?: Yes (2ml earlier , 1 ml after attemlte to breast feed )  Feeding Feeding Type: Breast Fed Length of feed: 0 min  LATCH Score/Interventions                Intervention(s): Breastfeeding basics reviewed     Lactation Tools Discussed/Used     Consult Status Consult Status: Follow-up Date: 03/01/15 Follow-up type: In-patient    [name] 03/01/2015, 3:26 PM

## 2015-03-01 NOTE — Anesthesia Postprocedure Evaluation (Signed)
Anesthesia Post Note  Patient: Alexandra Nguyen  Procedure(s) Performed: * No procedures listed *  Patient location during evaluation: Mother Baby Anesthesia Type: Epidural Level of consciousness: awake and alert Pain management: pain level controlled Vital Signs Assessment: post-procedure vital signs reviewed and stable Respiratory status: spontaneous breathing Cardiovascular status: stable Postop Assessment: epidural receding, no backache, no headache, patient able to bend at knees and no signs of nausea or vomiting Anesthetic complications: no    Last Vitals:  Filed Vitals:   03/01/15 0635 03/01/15 0740  BP: 128/69 119/67  Pulse: 83 70  Temp: 36.7 C 36.7 C  Resp: 18 18    Last Pain:  Filed Vitals:   03/01/15 0752  PainSc: 0-No pain                 Edison PaceWILKERSON,Lang Zingg

## 2015-03-01 NOTE — Anesthesia Procedure Notes (Signed)
Epidural Patient location during procedure: OB Start time: 03/01/2015 12:05 AM  Staffing Anesthesiologist: Mal AmabileFOSTER, Drake Wuertz Performed by: anesthesiologist   Preanesthetic Checklist Completed: patient identified, site marked, surgical consent, pre-op evaluation, timeout performed, IV checked, risks and benefits discussed and monitors and equipment checked  Epidural Patient position: sitting Prep: site prepped and draped and DuraPrep Patient monitoring: continuous pulse ox and blood pressure Approach: midline Location: L3-L4 Injection technique: LOR air  Needle:  Needle type: Tuohy  Needle gauge: 17 G Needle length: 9 cm and 9 Needle insertion depth: 7 cm Catheter type: closed end flexible Catheter size: 19 Gauge Catheter at skin depth: 12 cm Test dose: negative and Other  Assessment Events: blood not aspirated, injection not painful, no injection resistance, negative IV test and no paresthesia  Additional Notes Patient identified. Risks and benefits discussed including failed block, incomplete  Pain control, post dural puncture headache, nerve damage, paralysis, blood pressure Changes, nausea, vomiting, reactions to medications-both toxic and allergic and post Partum back pain. All questions were answered. Patient expressed understanding and wished to proceed. Sterile technique was used throughout procedure. Epidural site was Dressed with sterile barrier dressing. No paresthesias, signs of intravascular injection Or signs of intrathecal spread were encountered.  Patient was more comfortable after the epidural was dosed. Please see RN's note for documentation of vital signs and FHR which are stable.

## 2015-03-01 NOTE — Progress Notes (Signed)
Tenny Crawoss, MD aware of current SVE, will labor down.

## 2015-03-01 NOTE — Progress Notes (Signed)
Patient is eating, ambulating, voiding.  Pain control is good.  Filed Vitals:   03/01/15 0546 03/01/15 0556 03/01/15 0635 03/01/15 0740  BP: 110/63 125/80 128/69 119/67  Pulse: 78 66 83 70  Temp:   98 F (36.7 C) 98 F (36.7 C)  TempSrc:   Oral Oral  Resp:   18 18  Height:      Weight:      SpO2:   100%     Fundus firm Perineum without swelling.  Lab Results  Component Value Date   WBC 11.8* 02/28/2015   HGB 11.0* 02/28/2015   HCT 33.6* 02/28/2015   MCV 84.6 02/28/2015   PLT 218 02/28/2015    --/--/B POS, B POS (12/05 1625)/RI  A/P Post partum day 0.  Routine care.  Expect d/c routine.    Alexandra Nguyen A

## 2015-03-02 LAB — CBC
HEMATOCRIT: 29 % — AB (ref 36.0–46.0)
Hemoglobin: 9.3 g/dL — ABNORMAL LOW (ref 12.0–15.0)
MCH: 27 pg (ref 26.0–34.0)
MCHC: 32.1 g/dL (ref 30.0–36.0)
MCV: 84.1 fL (ref 78.0–100.0)
PLATELETS: 200 10*3/uL (ref 150–400)
RBC: 3.45 MIL/uL — AB (ref 3.87–5.11)
RDW: 14.1 % (ref 11.5–15.5)
WBC: 10 10*3/uL (ref 4.0–10.5)

## 2015-03-02 MED ORDER — FERROUS SULFATE 325 (65 FE) MG PO TABS: 325.0000 mg | ORAL_TABLET | Freq: Every day | ORAL | Status: DC

## 2015-03-02 MED ORDER — IBUPROFEN 600 MG PO TABS: 600.0000 mg | ORAL_TABLET | Freq: Four times a day (QID) | ORAL | Status: DC

## 2015-03-02 NOTE — Discharge Summary (Signed)
Obstetric Discharge Summary Reason for Admission: rupture of membranes Prenatal Procedures: none Intrapartum Procedures: spontaneous vaginal delivery Postpartum Procedures: none Complications-Operative and Postpartum: none HEMOGLOBIN  Date Value Ref Range Status  03/02/2015 9.3* 12.0 - 15.0 g/dL Final   HCT  Date Value Ref Range Status  03/02/2015 29.0* 36.0 - 46.0 % Final    Physical Exam:  General: alert, cooperative and appears stated age Lochia: appropriate Uterine Fundus: firm DVT Evaluation: No evidence of DVT seen on physical exam. Negative Homan's sign.  Discharge Diagnoses: Term Pregnancy-delivered  Discharge Information: Date: 03/02/2015 Activity: pelvic rest Diet: routine Medications: PNV, Ibuprofen and Iron Condition: stable Instructions: refer to practice specific booklet Discharge to: home Follow-up Information    Follow up with Almon HerculesOSS,KENDRA H., MD In 4 weeks.   Specialty:  Obstetrics and Gynecology   Contact information:   [address] [place] [address] [phone]       Newborn Data: Live born female  Birth Weight: 6 lb 7.5 oz (2935 g) APGAR: 8, 9  Home with mother.  Alexandra Nguyen GEFFEL Keidan Aumiller 03/02/2015, 9:15 AM

## 2015-03-02 NOTE — Discharge Instructions (Signed)

## 2015-03-02 NOTE — Progress Notes (Signed)
Patient is doing well.  She is ambulating, voiding, tolerating PO.  Pain control is good.  Lochia is appropriate  Filed Vitals:   03/01/15 0740 03/01/15 1130 03/01/15 1924 03/02/15 0618  BP: 119/67 126/69 132/71 116/59  Pulse: 70 82 68 72  Temp: 98 F (36.7 C) 98.4 F (36.9 C) 98.2 F (36.8 C) 98.2 F (36.8 C)  TempSrc: Oral Oral Oral Oral  Resp: 18 18 18 16   Height:      Weight:      SpO2:    96%    NAD Fundus firm Ext: trace edema b/l  Lab Results  Component Value Date   WBC 10.0 03/02/2015   HGB 9.3* 03/02/2015   HCT 29.0* 03/02/2015   MCV 84.1 03/02/2015   PLT 200 03/02/2015    --/--/B POS, B POS (12/05 1625)/RImmune  A/P 24 y.o. G1P1001 PPD#1 s/p TSVD. Routine care.  Meeting all goals--pt desires discharge today Desires circ in office--discussed no available procedure spots at this time and will need to perform prior to discharge from hospital.  Patient declines circ at this time.    [facility] O[facility]ovisDYANNA Nguyen [employer] Tim[employer] CompanyCLARK

## 2015-03-03 ENCOUNTER — Ambulatory Visit (HOSPITAL_COMMUNITY)
Admission: RE | Admit: 2015-03-03 | Discharge: 2015-03-03 | Disposition: A | Discharge status: Home / Self Care | Payer: BLUE CROSS/BLUE SHIELD | Source: Ambulatory Visit | Attending: Family Medicine | Admitting: Family Medicine

## 2015-03-03 NOTE — Lactation Note (Addendum)
Lactation Consult for [employer] (DOB: [birth year])  Mother's reason for visit: "I think my baby isn't eating enough" Consult:  Initial Lactation Consultant:  Larkin Ina  ________________________________________________________________________ BW: 1410V (6# 7.5oz) D/c wt: 6# 4.3oz (down 3%) Today's weight: 2730g (6# 0.3oz, 6.9% below BW) ________________________________________________________________________  Mother's Name: Ladoris Gene Type of delivery:  Vag Breastfeeding Experience: primip Maternal Medical Conditions:  anemia, asthma, obesity Maternal Medications: PNV    ________________________________________________________________________  Breastfeeding History (Post Discharge)  Frequency of breastfeeding: as often as possible Duration of feeding: 1-2 minutes  Patient does not supplement or pump.  Infant Intake and Output Assessment  Voids: 2 voids in 24 hrs.  Color:  Clear yellow Stools: No stool in 24 hrs.  Color:  Meconium  ________________________________________________________________________  Maternal Breast Assessment  Breast:  Full Nipple:  Erect _______________________________________________________________________ Feeding Assessment/Evaluation  Initial feeding assessment:  Infant's oral assessment:  WNL  Attached assessment:  Deep  Lips flanged:  Yes.    Suck assessment:  Nutritive  Pre-feed weight: 2730 g   Post-feed weight: 2750 g  Amount transferred: 20 ml L breast, 10 minutes  Pre-feed weight: 2750 g   Post-feed weight: 2760 g  Amount transferred: 10 ml (*this included a spoon-feeding of 1.33m of EBM).  R breast, a few minutes  Total amount transferred: 30 ml+ (this did include a spoon-feeding of about 1.533mof EBM).   Mom called the Lactation dept concerned b/c KyAlvera Novelad not had a stool in 24 hours and he had only had 2 voids over the last 24 hours. In addition, he was only feeding at the breast for 1-2 minutes  at a time. Per our request, Mom brought KyAlvera Noveln so we could assist her w/breastfeedign. KyAlvera Novelas 6058ours old at the time of this consult.   Per Mom, her milk came to volume this morning & she felt that Ky's last good feeding was when he was still in the hospital. Mom was assisted w/latching her baby using the teacup hold. He latched w/ease & fed continuously for 10 minutes. He unlatched on his own, satiated. Thirty minutes later, he was ready to go to the other breast. He only fed for a few minutes, but consumed an additional 1018m  While Mom was here, I taught her the signs/sounds of swallows. She was taught how to do hand-expression and spoon-feed her milk to Ky,[place] he were to fail to latch at home. She was also instructed on how to assemble and use the hand pump that was in the breast pump kit that she was sent home with. Mom understands that she needs to express her milk if her breasts become too full. We also practiced how to latch w/the teacup hold and how to get an asymmetric latch.   Mom was praised for calling when she was concerned and for bringing Ky Alvera Novel to be seen this evening. Mom was encouraged to call w/questions. She was also given additional breastfeeding resources, including our free support groups.   When Mom was preparing to leave, Ky Alvera Novelgan showing feeding cues.  Ky latched only briefly, but multiple swallows were heard and he unlatched himself, satiated. This last feeding was not weighed.   Ky Alvera Novels a pediatric appt w/Dr. McFMalen Gauzemorrow morning.   KimElinor DodgeN, IBCLC

## 2015-06-11 IMAGING — US US OB TRANSVAGINAL
1 series · 14 of 28 positions shown · non-contrast
Comparison: None.

CLINICAL DATA: Pelvic pain and vomiting for 3-4 weeks.
First-trimester pregnancy.

EXAM:
OBSTETRIC <14 WK US AND TRANSVAGINAL OB US
TECHNIQUE: Both transabdominal and transvaginal ultrasound examinations were
performed for complete evaluation of the gestation as well as the
maternal uterus, adnexal regions, and pelvic cul-de-sac.
Transvaginal technique was performed to assess early pregnancy.

[Series 1: us ob transvaginal · 0.21mm/px · 14 of 84 slices shown]
[im 4/84]
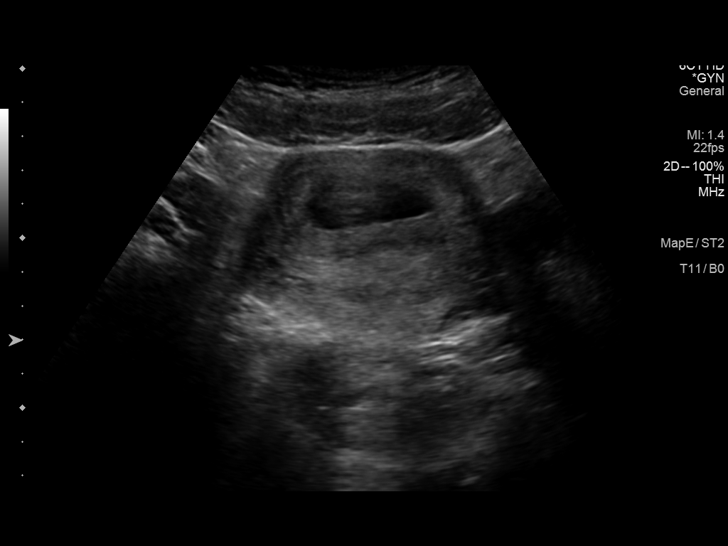
[im 10/84]
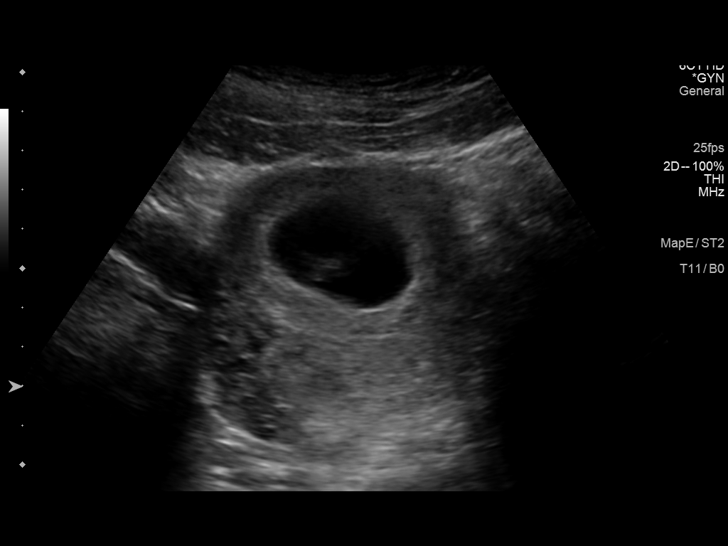
[im 16/84]
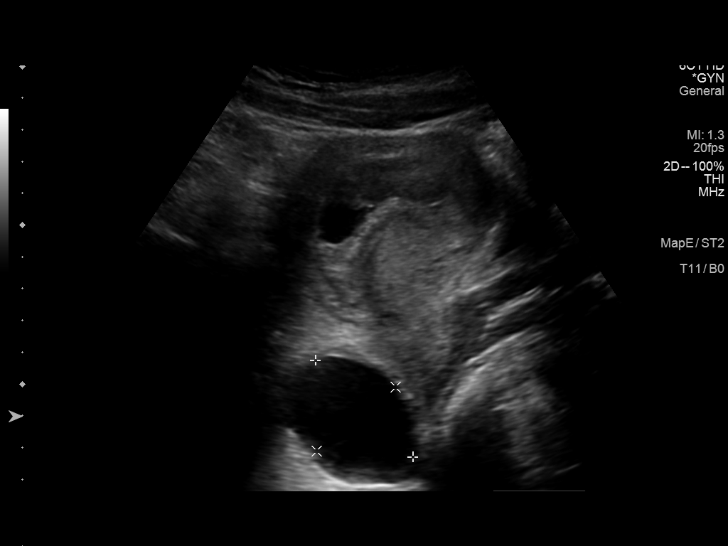
[im 22/84]
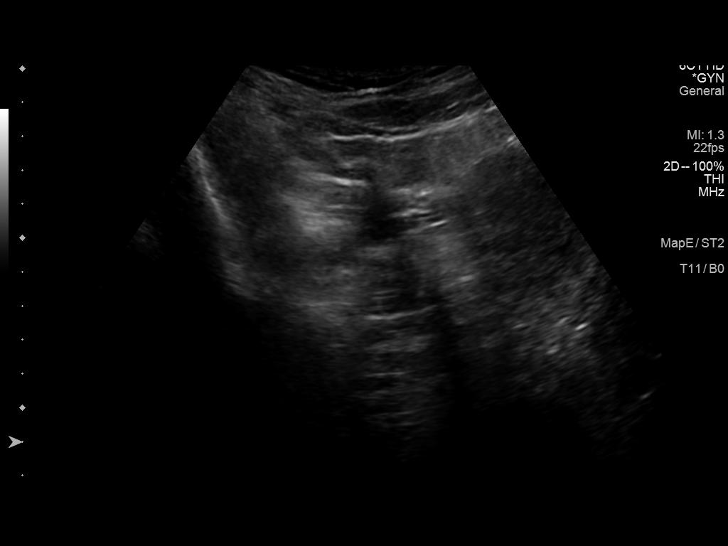
[im 28/84]
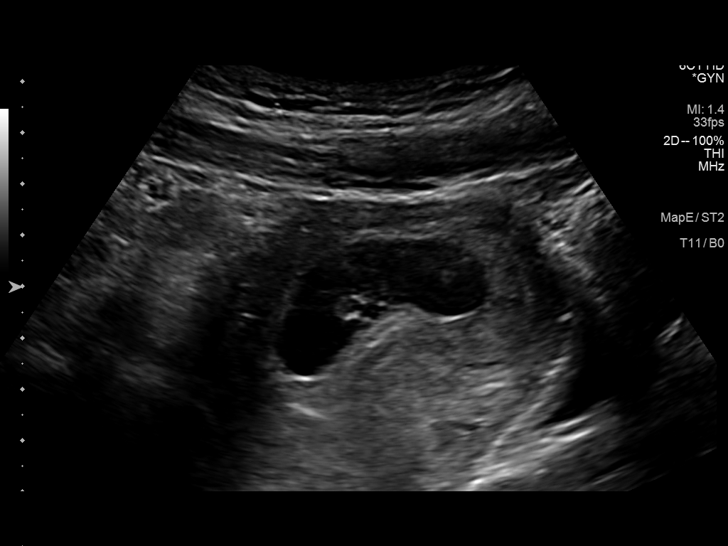
[im 34/84]
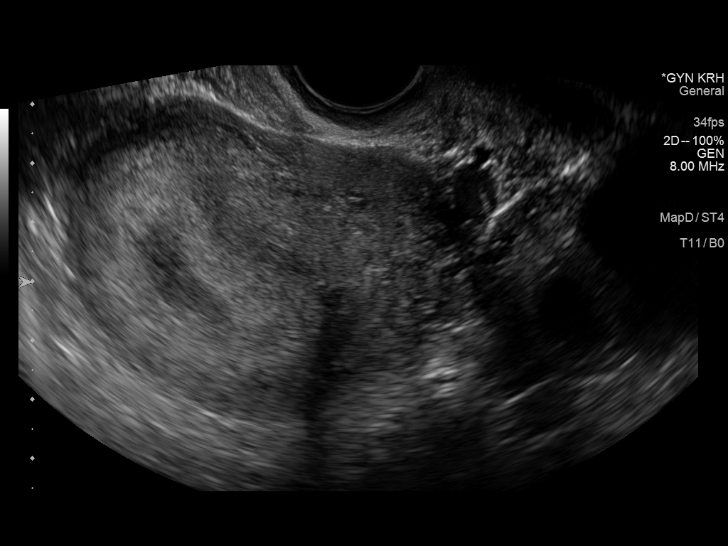
[im 40/84]
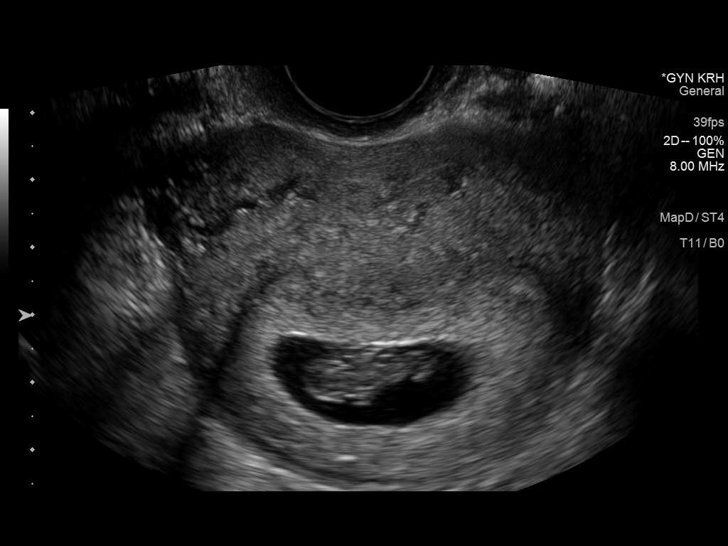
[im 47/84]
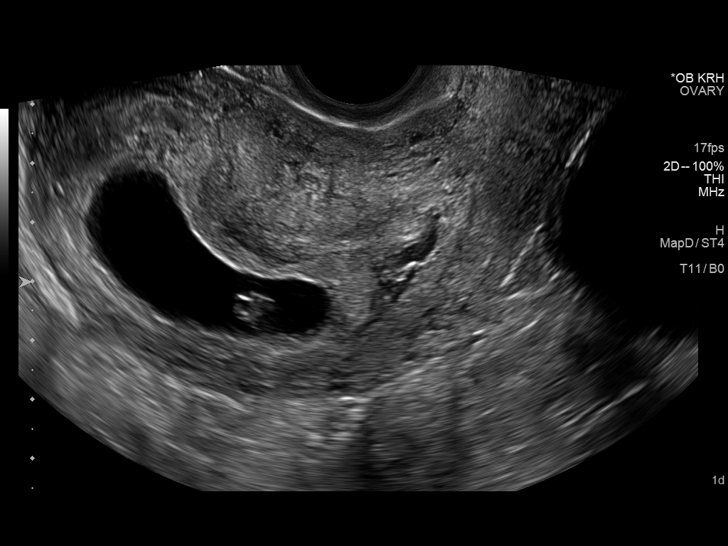
[im 53/84]
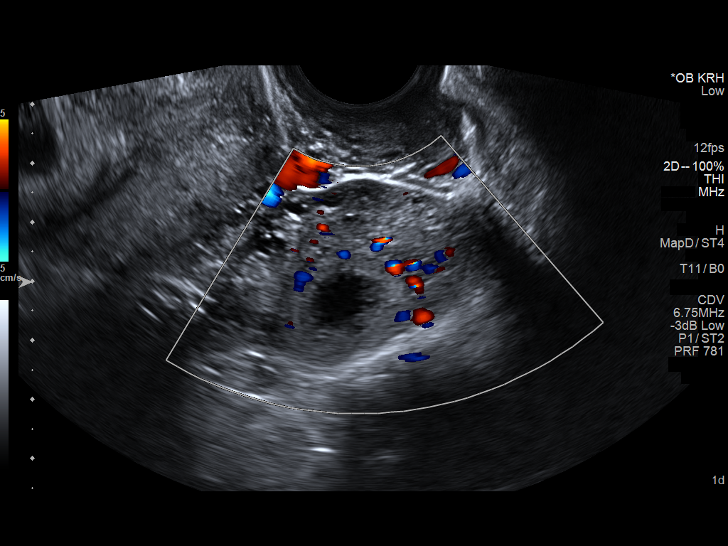
[im 59/84]
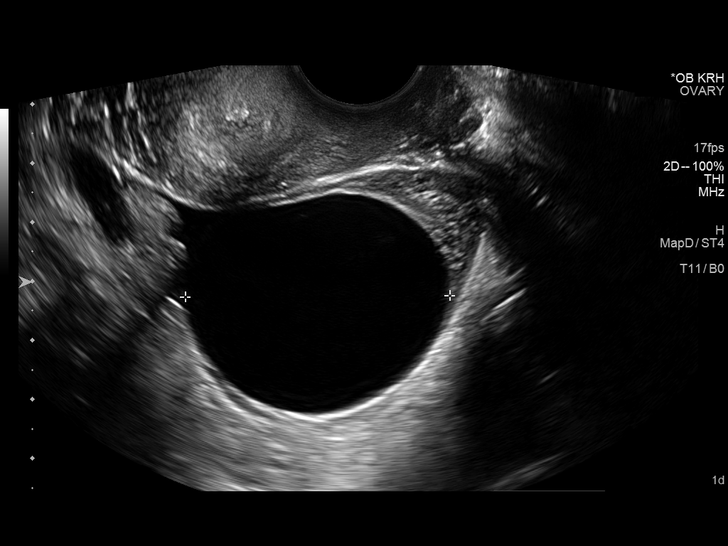
[im 65/84]
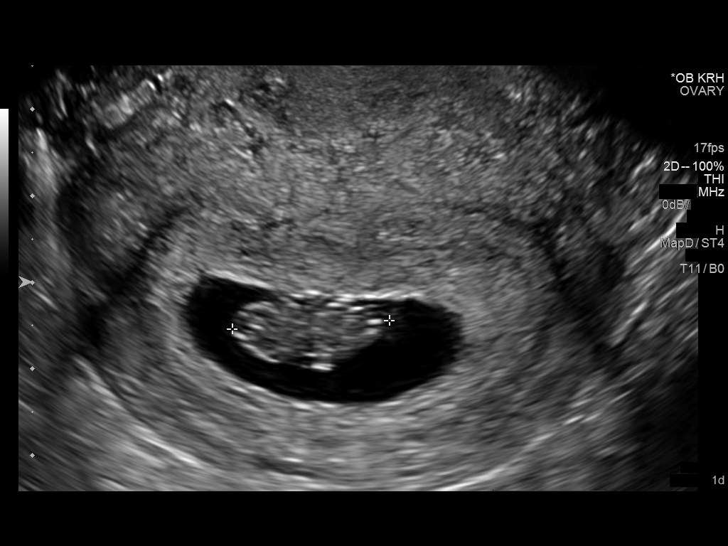
[im 71/84]
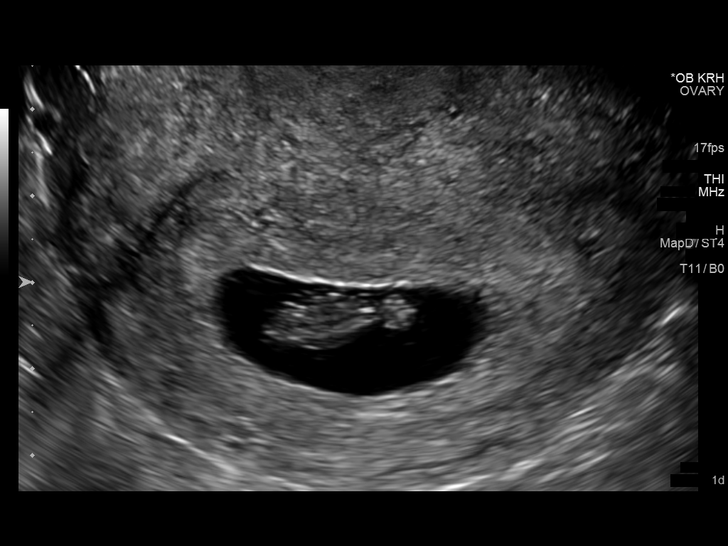
[im 77/84]
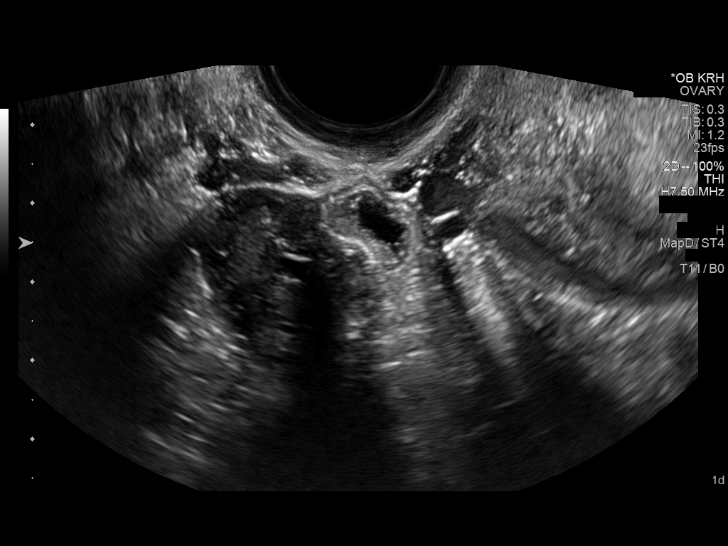
[im 84/84]
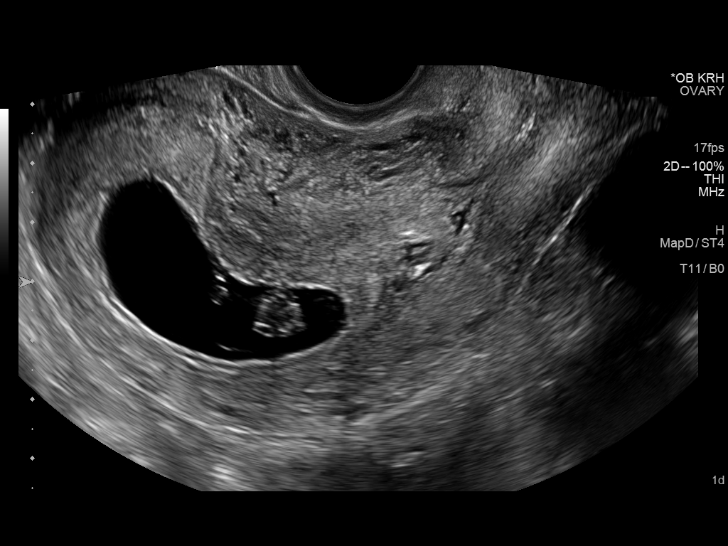

[14 of 28 positions shown; findings below may reference images not displayed]

FINDINGS: Intrauterine gestational sac: Visualized/normal in shape.

Yolk sac:  Present and normal

Embryo:  Present

Cardiac Activity: Present

Heart Rate: 165  bpm

CRL:  18  mm   8 w   2 d                  US EDC: 03/08/2015

Maternal uterus/adnexae: Small simple appearing free pelvic fluid.
The right ovary is unremarkable. The left ovary contains a corpus
luteum cyst and a 5 cm simple appearing cyst. Color flow is seen in
the left ovary around the corpus luteum.
IMPRESSION: 1. Single living intrauterine gestation at 8 weeks 2 days.
2. 5 cm left ovarian cyst.
3. Small simple pelvic fluid.

## 2015-10-14 ENCOUNTER — Other Ambulatory Visit: Payer: Self-pay | Admitting: Obstetrics and Gynecology

## 2016-09-16 ENCOUNTER — Emergency Department (HOSPITAL_BASED_OUTPATIENT_CLINIC_OR_DEPARTMENT_OTHER)
Admission: EM | Admit: 2016-09-16 | Discharge: 2016-09-16 | Disposition: A | Discharge status: Home / Self Care | Payer: BLUE CROSS/BLUE SHIELD | Attending: Emergency Medicine | Admitting: Emergency Medicine

## 2016-09-16 ENCOUNTER — Encounter (HOSPITAL_BASED_OUTPATIENT_CLINIC_OR_DEPARTMENT_OTHER): Payer: Self-pay | Admitting: Emergency Medicine

## 2016-09-16 DIAGNOSIS — Z793 Long term (current) use of hormonal contraceptives: Secondary | ICD-10-CM | POA: Insufficient documentation

## 2016-09-16 DIAGNOSIS — Z791 Long term (current) use of non-steroidal anti-inflammatories (NSAID): Secondary | ICD-10-CM | POA: Insufficient documentation

## 2016-09-16 DIAGNOSIS — Z87891 Personal history of nicotine dependence: Secondary | ICD-10-CM | POA: Insufficient documentation

## 2016-09-16 DIAGNOSIS — A599 Trichomoniasis, unspecified: Secondary | ICD-10-CM | POA: Insufficient documentation

## 2016-09-16 DIAGNOSIS — Z79899 Other long term (current) drug therapy: Secondary | ICD-10-CM | POA: Insufficient documentation

## 2016-09-16 DIAGNOSIS — N739 Female pelvic inflammatory disease, unspecified: Secondary | ICD-10-CM | POA: Insufficient documentation

## 2016-09-16 DIAGNOSIS — N730 Acute parametritis and pelvic cellulitis: Secondary | ICD-10-CM

## 2016-09-16 LAB — URINALYSIS, MICROSCOPIC (REFLEX)

## 2016-09-16 LAB — CBC WITH DIFFERENTIAL/PLATELET
BASOS ABS: 0 10*3/uL (ref 0.0–0.1)
BASOS PCT: 1 %
Eosinophils Absolute: 0.1 10*3/uL (ref 0.0–0.7)
Eosinophils Relative: 1 %
HEMATOCRIT: 37.3 % (ref 36.0–46.0)
HEMOGLOBIN: 12.5 g/dL (ref 12.0–15.0)
Lymphocytes Relative: 31 %
Lymphs Abs: 1.5 10*3/uL (ref 0.7–4.0)
MCH: 27.9 pg (ref 26.0–34.0)
MCHC: 33.5 g/dL (ref 30.0–36.0)
MCV: 83.3 fL (ref 78.0–100.0)
MONOS PCT: 6 %
Monocytes Absolute: 0.3 10*3/uL (ref 0.1–1.0)
NEUTROS ABS: 2.9 10*3/uL (ref 1.7–7.7)
NEUTROS PCT: 61 %
Platelets: 219 10*3/uL (ref 150–400)
RBC: 4.48 MIL/uL (ref 3.87–5.11)
RDW: 13.9 % (ref 11.5–15.5)
WBC: 4.8 10*3/uL (ref 4.0–10.5)

## 2016-09-16 LAB — URINALYSIS, ROUTINE W REFLEX MICROSCOPIC
Glucose, UA: NEGATIVE mg/dL
Ketones, ur: 40 mg/dL — AB
Nitrite: NEGATIVE
PH: 6 (ref 5.0–8.0)
Protein, ur: 30 mg/dL — AB
SPECIFIC GRAVITY, URINE: 1.024 (ref 1.005–1.030)

## 2016-09-16 LAB — WET PREP, GENITAL
CLUE CELLS WET PREP: NONE SEEN
SPERM: NONE SEEN
Yeast Wet Prep HPF POC: NONE SEEN

## 2016-09-16 LAB — PREGNANCY, URINE: Preg Test, Ur: NEGATIVE

## 2016-09-16 LAB — HCG, QUANTITATIVE, PREGNANCY: hCG, Beta Chain, Quant, S: <1 m[IU]/mL (ref <5)

## 2016-09-16 MED ORDER — METRONIDAZOLE 500 MG PO TABS: 500.0000 mg | ORAL_TABLET | Freq: Two times a day (BID) | ORAL | 0 refills | Status: AC

## 2016-09-16 MED ORDER — CEFTRIAXONE SODIUM 250 MG IJ SOLR
250.0000 mg | Freq: Once | INTRAMUSCULAR | Status: AC
  Administered 2016-09-16: 250 mg via INTRAMUSCULAR
  Filled 2016-09-16: qty 250

## 2016-09-16 MED ORDER — DOXYCYCLINE HYCLATE 100 MG PO CAPS: 100.0000 mg | ORAL_CAPSULE | Freq: Two times a day (BID) | ORAL | 0 refills | Status: DC

## 2016-09-16 MED ORDER — NORGESTIMATE-ETH ESTRADIOL 0.25-35 MG-MCG PO TABS: 1.0000 | ORAL_TABLET | Freq: Every day | ORAL | 11 refills | Status: AC

## 2016-09-16 MED ORDER — METRONIDAZOLE 500 MG PO TABS
500.0000 mg | ORAL_TABLET | Freq: Once | ORAL | Status: AC
  Administered 2016-09-16: 500 mg via ORAL
  Filled 2016-09-16: qty 1

## 2016-09-16 MED ORDER — DOXYCYCLINE HYCLATE 100 MG PO TABS
100.0000 mg | ORAL_TABLET | Freq: Once | ORAL | Status: AC
  Administered 2016-09-16: 100 mg via ORAL
  Filled 2016-09-16: qty 1

## 2016-09-16 MED ORDER — NORGESTIMATE-ETH ESTRADIOL 0.25-35 MG-MCG PO TABS: 1.0000 | ORAL_TABLET | Freq: Every day | ORAL | 11 refills | Status: DC

## 2016-09-16 NOTE — ED Triage Notes (Signed)
"   I had my period x 2 weeks ago and I have not stopped bleeding and I am sick to my stomach"  Has used 5 tampons in past 24 hrs, [facility]

## 2016-09-16 NOTE — ED Provider Notes (Signed)
[facility] Provider Note   CSN: [id number] Arrival date & time: 09/16/16  1000     History   Chief Complaint Chief Complaint  Patient presents with  . Vaginal Bleeding    HPI Alexandra Nguyen is a 26 y.o. female.   Vaginal Bleeding  Primary symptoms include discharge, pelvic pain, vaginal bleeding. There has been no fever. This is a new problem. The current episode started more than 1 week ago. The problem occurs constantly. The problem has not changed since onset.She is not pregnant. The patient's menstrual history has been irregular. The discharge was bloody. Associated symptoms include abdominal pain. Pertinent negatives include no anorexia, no diarrhea and no nausea.    Past Medical History:  Diagnosis Date  . Allergy   . Anemia     Patient Active Problem List   Diagnosis Date Noted  . Spontaneous vaginal delivery 03/01/2015  . Active labor 02/28/2015  . Near syncope 08/07/2014  . Dehydration, mild 08/07/2014  . Nausea and vomiting during pregnancy prior to [redacted] weeks gestation 08/07/2014  . Gonorrhea affecting pregnancy in first trimester 08/07/2014    Past Surgical History:  Procedure Laterality Date  . NO PAST SURGERIES      OB History    Gravida Para Term Preterm AB Living   1 1 1     1    SAB TAB Ectopic Multiple Live Births         0 1       Home Medications    Prior to Admission medications   Medication Sig Start Date End Date Taking? Authorizing Provider  doxycycline (VIBRAMYCIN) 100 MG capsule Take 1 capsule (100 mg total) by mouth 2 (two) times daily. One po bid x 14 days 09/16/16   Taiylor Virden, Barbara CowerJason, MD  ferrous sulfate 325 (65 FE) MG tablet Take 1 tablet (325 mg total) by mouth daily with breakfast. 03/02/15   Marlow Baarslark, Dyanna, MD  ibuprofen (ADVIL,MOTRIN) 600 MG tablet Take 1 tablet (600 mg total) by mouth every 6 (six) hours. 03/02/15   Marlow Baarslark, Dyanna, MD  metroNIDAZOLE (FLAGYL) 500 MG tablet Take 1 tablet (500 mg total) by mouth 2 (two)  times daily. One po bid x 14 days 09/16/16 09/30/16  Jameriah Trotti, Barbara CowerJason, MD  norgestimate-ethinyl estradiol (SPRINTEC 28) 0.25-35 MG-MCG tablet Take 1 tablet by mouth daily. 09/16/16   Treasure Ingrum, Barbara CowerJason, MD  norgestimate-ethinyl estradiol (SPRINTEC 28) 0.25-35 MG-MCG tablet Take 1 tablet by mouth daily. 09/16/16   Elenie Coven, Barbara CowerJason, MD    Family History Family History  Problem Relation Age of Onset  . Alcohol abuse Neg Hx   . Asthma Neg Hx   . Arthritis Neg Hx   . Birth defects Neg Hx   . Cancer Neg Hx   . COPD Neg Hx   . Depression Neg Hx   . Diabetes Neg Hx   . Drug abuse Neg Hx   . Early death Neg Hx   . Hearing loss Neg Hx   . Heart disease Neg Hx   . Hyperlipidemia Neg Hx   . Hypertension Neg Hx   . Kidney disease Neg Hx   . Learning disabilities Neg Hx   . Mental illness Neg Hx   . Mental retardation Neg Hx   . Miscarriages / Stillbirths Neg Hx   . Stroke Neg Hx   . Varicose Veins Neg Hx   . Vision loss Neg Hx     Social History Social History  Substance Use Topics  . Smoking status: Former  Smoker  . Smokeless tobacco: Never Used  . Alcohol use No     Allergies   A & d [dimethicone-zinc oxide-vit a-d] and Fish allergy   Review of Systems Review of Systems  Gastrointestinal: Positive for abdominal pain. Negative for anorexia, diarrhea and nausea.  Genitourinary: Positive for pelvic pain and vaginal bleeding.  All other systems reviewed and are negative.    Physical Exam Updated Vital Signs BP 115/79 (BP Location: Right Arm)   Pulse 82   Temp 97.7 F (36.5 C) (Oral)   Resp 16   Ht 5\' 8"  (1.727 m)   Wt 90.7 kg (200 lb)   LMP 09/02/2016 (Exact Date)   SpO2 100%   BMI 30.41 kg/m   Physical Exam  Constitutional: She appears well-developed and well-nourished.  HENT:  Head: Normocephalic and atraumatic.  Eyes: Conjunctivae and EOM are normal.  Neck: Normal range of motion.  Cardiovascular: Normal rate and regular rhythm.   Pulmonary/Chest: No stridor. No  respiratory distress.  Abdominal: Soft. Bowel sounds are normal. She exhibits no distension.  Genitourinary: Cervix exhibits motion tenderness. Right adnexum displays tenderness. Left adnexum displays tenderness. No signs of injury around the vagina.  Neurological: She is alert.  Nursing note and vitals reviewed.    ED Treatments / Results  Labs (all labs ordered are listed, but only abnormal results are displayed) Labs Reviewed  WET PREP, GENITAL - Abnormal; Notable for the following:       Result Value   Trich, Wet Prep PRESENT (*)    WBC, Wet Prep HPF POC MODERATE (*)    All other components within normal limits  URINALYSIS, ROUTINE W REFLEX MICROSCOPIC - Abnormal; Notable for the following:    Color, Urine AMBER (*)    APPearance CLOUDY (*)    Hgb urine dipstick LARGE (*)    Bilirubin Urine SMALL (*)    Ketones, ur 40 (*)    Protein, ur 30 (*)    Leukocytes, UA MODERATE (*)    All other components within normal limits  URINALYSIS, MICROSCOPIC (REFLEX) - Abnormal; Notable for the following:    Bacteria, UA MANY (*)    Squamous Epithelial / LPF 0-5 (*)    All other components within normal limits  URINE CULTURE  PREGNANCY, URINE  HCG, QUANTITATIVE, PREGNANCY  CBC WITH DIFFERENTIAL/PLATELET  RPR  HIV ANTIBODY (ROUTINE TESTING)  GC/CHLAMYDIA PROBE AMP ([place]) NOT AT [facility]    EKG  EKG Interpretation None       Radiology No results found.  Procedures Procedures (including critical care time)  Medications Ordered in ED Medications  cefTRIAXone (ROCEPHIN) injection 250 mg (250 mg Intramuscular Given 09/16/16 1214)  metroNIDAZOLE (FLAGYL) tablet 500 mg (500 mg Oral Given 09/16/16 1214)  doxycycline (VIBRA-TABS) tablet 100 mg (100 mg Oral Given 09/16/16 1214)     Initial Impression / Assessment and Plan / ED Course  I have reviewed the triage vital signs and the nursing notes.  Pertinent labs & imaging results that were available during my care of the  patient were reviewed by me and considered in my medical decision making (see chart for details).     Treated for PID/UTI.   Also with dysfunctional uterine bleeding, will restart OCP's and have PCP follow up to ensure nothing more significant.   Final Clinical Impressions(s) / ED Diagnoses   Final diagnoses:  Trichomonas infection  PID (acute pelvic inflammatory disease)    New Prescriptions Discharge Medication List as of 09/16/2016 12:07 PM  START taking these medications   Details  doxycycline (VIBRAMYCIN) 100 MG capsule Take 1 capsule (100 mg total) by mouth 2 (two) times daily. One po bid x 14 days, Starting Sun 09/16/2016, Print    metroNIDAZOLE (FLAGYL) 500 MG tablet Take 1 tablet (500 mg total) by mouth 2 (two) times daily. One po bid x 14 days, Starting Sun 09/16/2016, Until Sun 09/30/2016, Print    !! norgestimate-ethinyl estradiol (SPRINTEC 28) 0.25-35 MG-MCG tablet Take 1 tablet by mouth daily., Starting Sun 09/16/2016, Print    !! norgestimate-ethinyl estradiol (SPRINTEC 28) 0.25-35 MG-MCG tablet Take 1 tablet by mouth daily., Starting Sun 09/16/2016, Print     !! - Potential duplicate medications found. Please discuss with provider.       Messina Kosinski, Barbara Cower, MD 09/16/16 [phone]

## 2016-09-16 NOTE — Discharge Instructions (Signed)
You have received 2 prescriptions for a medication called Sprintec. This is an oral contraceptive. The first prescription is for one package. This one package he should take 3 pills a day for one week then throw the rest away. This should help with your uterine bleeding. It should cease during this time. After this week take one week off and during this time and will likely have a regular period. After the one week off without medication get the other prescription filled which is the same medication. Then start taking it once daily as instructed on the package. This may cause a little bit of nausea when you're taking 3 a day if so you may back down to twice a day. Please follow-up with her gynecologist in 1-2 weeks for further evaluation and management of your vaginal bleeding. °

## 2016-09-17 LAB — HIV ANTIBODY (ROUTINE TESTING W REFLEX): HIV Screen 4th Generation wRfx: NONREACTIVE

## 2016-09-17 LAB — GC/CHLAMYDIA PROBE AMP (~~LOC~~) NOT AT ARMC
CHLAMYDIA, DNA PROBE: NEGATIVE
NEISSERIA GONORRHEA: NEGATIVE

## 2016-09-17 LAB — RPR: RPR Ser Ql: NONREACTIVE

## 2016-09-18 LAB — URINE CULTURE

## 2018-05-06 ENCOUNTER — Emergency Department (HOSPITAL_BASED_OUTPATIENT_CLINIC_OR_DEPARTMENT_OTHER)
Admission: EM | Admit: 2018-05-06 | Discharge: 2018-05-06 | Disposition: A | Discharge status: Home / Self Care | Payer: BLUE CROSS/BLUE SHIELD | Attending: Emergency Medicine | Admitting: Emergency Medicine

## 2018-05-06 ENCOUNTER — Other Ambulatory Visit: Payer: Self-pay

## 2018-05-06 ENCOUNTER — Encounter (HOSPITAL_BASED_OUTPATIENT_CLINIC_OR_DEPARTMENT_OTHER): Payer: Self-pay | Admitting: *Deleted

## 2018-05-06 DIAGNOSIS — Z5321 Procedure and treatment not carried out due to patient leaving prior to being seen by health care provider: Secondary | ICD-10-CM | POA: Insufficient documentation

## 2018-05-06 DIAGNOSIS — R109 Unspecified abdominal pain: Secondary | ICD-10-CM | POA: Diagnosis not present

## 2018-05-06 LAB — URINALYSIS, ROUTINE W REFLEX MICROSCOPIC
Bilirubin Urine: NEGATIVE
GLUCOSE, UA: NEGATIVE mg/dL
KETONES UR: NEGATIVE mg/dL
NITRITE: NEGATIVE
Protein, ur: NEGATIVE mg/dL
pH: 6 (ref 5.0–8.0)

## 2018-05-06 LAB — URINALYSIS, MICROSCOPIC (REFLEX)

## 2018-05-06 LAB — PREGNANCY, URINE: Preg Test, Ur: NEGATIVE

## 2018-05-06 NOTE — ED Triage Notes (Signed)
Abdominal pain x 3 weeks. Vaginal discharge.

## 2018-05-06 NOTE — ED Notes (Signed)
Pt reported to this RN that she wanted to leave. Asked pt to wait due to urine has resulted and she was next to be seen. Pt declined and reported she would come back in the morning.

## 2018-05-07 ENCOUNTER — Emergency Department (HOSPITAL_BASED_OUTPATIENT_CLINIC_OR_DEPARTMENT_OTHER): Payer: BLUE CROSS/BLUE SHIELD

## 2018-05-07 ENCOUNTER — Encounter (HOSPITAL_BASED_OUTPATIENT_CLINIC_OR_DEPARTMENT_OTHER): Payer: Self-pay | Admitting: Emergency Medicine

## 2018-05-07 ENCOUNTER — Emergency Department (HOSPITAL_BASED_OUTPATIENT_CLINIC_OR_DEPARTMENT_OTHER)
Admission: EM | Admit: 2018-05-07 | Discharge: 2018-05-07 | Disposition: A | Discharge status: Home / Self Care | Payer: BLUE CROSS/BLUE SHIELD | Attending: Emergency Medicine | Admitting: Emergency Medicine

## 2018-05-07 DIAGNOSIS — A599 Trichomoniasis, unspecified: Secondary | ICD-10-CM | POA: Diagnosis not present

## 2018-05-07 DIAGNOSIS — B9689 Other specified bacterial agents as the cause of diseases classified elsewhere: Secondary | ICD-10-CM

## 2018-05-07 DIAGNOSIS — Z79899 Other long term (current) drug therapy: Secondary | ICD-10-CM | POA: Insufficient documentation

## 2018-05-07 DIAGNOSIS — Z87891 Personal history of nicotine dependence: Secondary | ICD-10-CM | POA: Insufficient documentation

## 2018-05-07 DIAGNOSIS — N739 Female pelvic inflammatory disease, unspecified: Secondary | ICD-10-CM | POA: Diagnosis not present

## 2018-05-07 DIAGNOSIS — R102 Pelvic and perineal pain: Secondary | ICD-10-CM | POA: Diagnosis present

## 2018-05-07 DIAGNOSIS — N760 Acute vaginitis: Secondary | ICD-10-CM | POA: Diagnosis not present

## 2018-05-07 LAB — URINALYSIS, MICROSCOPIC (REFLEX)

## 2018-05-07 LAB — URINALYSIS, ROUTINE W REFLEX MICROSCOPIC
Bilirubin Urine: NEGATIVE
Glucose, UA: NEGATIVE mg/dL
Ketones, ur: NEGATIVE mg/dL
Nitrite: NEGATIVE
Protein, ur: NEGATIVE mg/dL
Specific Gravity, Urine: 1.02 (ref 1.005–1.030)
pH: 7 (ref 5.0–8.0)

## 2018-05-07 LAB — WET PREP, GENITAL
Sperm: NONE SEEN
Yeast Wet Prep HPF POC: NONE SEEN

## 2018-05-07 LAB — PREGNANCY, URINE: Preg Test, Ur: NEGATIVE

## 2018-05-07 MED ORDER — LIDOCAINE HCL (PF) 1 % IJ SOLN
INTRAMUSCULAR | Status: AC
  Administered 2018-05-07: 5 mL
  Filled 2018-05-07: qty 5

## 2018-05-07 MED ORDER — DOXYCYCLINE HYCLATE 100 MG PO CAPS: 100.0000 mg | ORAL_CAPSULE | Freq: Two times a day (BID) | ORAL | 0 refills | Status: AC

## 2018-05-07 MED ORDER — CEFTRIAXONE SODIUM 250 MG IJ SOLR
250.0000 mg | Freq: Once | INTRAMUSCULAR | Status: AC
  Administered 2018-05-07: 250 mg via INTRAMUSCULAR
  Filled 2018-05-07: qty 250

## 2018-05-07 MED ORDER — METRONIDAZOLE 500 MG PO TABS: 500.0000 mg | ORAL_TABLET | Freq: Two times a day (BID) | ORAL | 0 refills | Status: AC

## 2018-05-07 MED FILL — DOXYCYCLINE HYCLATE 100 MG: 100 | 14 days supply | Qty: 28 | Fill #0

## 2018-05-07 MED FILL — metroNIDAZOLE 500 MG TABS: 500 | 14 days supply | Qty: 28 | Fill #0

## 2018-05-07 NOTE — ED Provider Notes (Signed)
[facility] EMERGENCY DEPARTMENT Provider Note   CSN: [id number] Arrival date & time: 05/07/18  1012     History   Chief Complaint Chief Complaint  Patient presents with  . Pelvic Pain    HPI Alexandra Nguyen is a 28 y.o. female presented today for concern of STI.  Patient states that she has had a mild pelvic pain that she describes as burning for the past 2 weeks without aggravating or alleviating symptoms.  Patient reports that she has had similar symptoms last year when she was diagnosed with trichomonas, she was subsequently treated with resolution of her symptoms at that time.  Patient states that she is sexually active with one partner and is concerned today for STDs and would like to be checked.  Patient denies unilateral pelvic pain, nausea/vomiting, diarrhea, upper abdominal pain or any additional concerns.  Patient states that she has been spotting over the past few days and has not noticed overt discharge however she states that she cannot be sure that she has been using tampons.  HPI  Past Medical History:  Diagnosis Date  . Allergy   . Anemia     Patient Active Problem List   Diagnosis Date Noted  . Spontaneous vaginal delivery 03/01/2015  . Active labor 02/28/2015  . Near syncope 08/07/2014  . Dehydration, mild 08/07/2014  . Nausea and vomiting during pregnancy prior to [redacted] weeks gestation 08/07/2014  . Gonorrhea affecting pregnancy in first trimester 08/07/2014    Past Surgical History:  Procedure Laterality Date  . NO PAST SURGERIES       OB History    Gravida  2   Para  1   Term  1   Preterm  0   AB  1   Living  1     SAB  0   TAB  1   Ectopic  0   Multiple  0   Live Births  1            Home Medications    Prior to Admission medications   Medication Sig Start Date End Date Taking? Authorizing Provider  doxycycline (VIBRAMYCIN) 100 MG capsule Take 1 capsule (100 mg total) by mouth 2 (two) times daily for 14  days. 05/07/18 05/21/18  Harlene Salts A, PA-C  ferrous sulfate 325 (65 FE) MG tablet Take 1 tablet (325 mg total) by mouth daily with breakfast. 03/02/15   Marlow Baars, MD  ibuprofen (ADVIL,MOTRIN) 600 MG tablet Take 1 tablet (600 mg total) by mouth every 6 (six) hours. 03/02/15   Marlow Baars, MD  metroNIDAZOLE (FLAGYL) 500 MG tablet Take 1 tablet (500 mg total) by mouth 2 (two) times daily for 14 days. 05/07/18 05/21/18  Bill Salinas, PA-C  norgestimate-ethinyl estradiol (SPRINTEC 28) 0.25-35 MG-MCG tablet Take 1 tablet by mouth daily. 09/16/16   Mesner, Barbara Cower, MD  norgestimate-ethinyl estradiol (SPRINTEC 28) 0.25-35 MG-MCG tablet Take 1 tablet by mouth daily. 09/16/16   Mesner, Barbara Cower, MD    Family History Family History  Problem Relation Age of Onset  . Alcohol abuse Neg Hx   . Asthma Neg Hx   . Arthritis Neg Hx   . Birth defects Neg Hx   . Cancer Neg Hx   . COPD Neg Hx   . Depression Neg Hx   . Diabetes Neg Hx   . Drug abuse Neg Hx   . Early death Neg Hx   . Hearing loss Neg Hx   . Heart disease Neg  Hx   . Hyperlipidemia Neg Hx   . Hypertension Neg Hx   . Kidney disease Neg Hx   . Learning disabilities Neg Hx   . Mental illness Neg Hx   . Mental retardation Neg Hx   . Miscarriages / Stillbirths Neg Hx   . Stroke Neg Hx   . Varicose Veins Neg Hx   . Vision loss Neg Hx     Social History Social History   Tobacco Use  . Smoking status: Former Smoker    Packs/day: 0.50    Years: 4.00    Pack years: 2.00    Types: E-cigarettes, Cigarettes    Last attempt to quit: 03/26/2018    Years since quitting: 0.1  . Smokeless tobacco: Never Used  Substance Use Topics  . Alcohol use: No  . Drug use: No     Allergies   A & d [dimethicone-zinc oxide-vit a-d] and Fish allergy   Review of Systems Review of Systems  Constitutional: Negative.  Negative for chills and fever.  Gastrointestinal: Negative.  Negative for abdominal pain, diarrhea, nausea and vomiting.    Genitourinary: Positive for pelvic pain and vaginal bleeding (Spotting). Negative for difficulty urinating, dysuria, flank pain, hematuria and vaginal pain.  Musculoskeletal: Negative.  Negative for arthralgias and myalgias.  Skin: Negative.  Negative for rash.  All other systems reviewed and are negative.  Physical Exam Updated Vital Signs BP 122/83 (BP Location: Right Arm)   Pulse (!) 50   Temp 98.2 F (36.8 C) (Oral)   Resp 14   Ht 5\' 9"  (1.753 m)   Wt 83.9 kg   LMP 04/23/2018 (Approximate)   SpO2 100%   BMI 27.32 kg/m   Physical Exam Constitutional:      General: She is not in acute distress.    Appearance: She is well-developed.  HENT:     Head: Normocephalic and atraumatic.     Right Ear: External ear normal.     Left Ear: External ear normal.     Nose: Nose normal.  Eyes:     Pupils: Pupils are equal, round, and reactive to light.  Neck:     Musculoskeletal: Normal range of motion.     Trachea: Trachea normal. No tracheal deviation.  Cardiovascular:     Rate and Rhythm: Normal rate and regular rhythm.  Pulmonary:     Effort: Pulmonary effort is normal. No respiratory distress.     Breath sounds: Normal breath sounds.  Chest:     Chest wall: No tenderness.  Abdominal:     General: Bowel sounds are normal. There is no distension.     Palpations: Abdomen is soft.     Tenderness: There is no abdominal tenderness. There is no right CVA tenderness, left CVA tenderness, guarding or rebound. Negative signs include Murphy's sign, Rovsing's sign and McBurney's sign.  Genitourinary:    Comments: Exam chaperoned by Earlie CountsAlexis RN.  Pelvic exam: normal external genitalia without evidence of trauma. VULVA: normal appearing vulva with no masses, tenderness or lesion. VAGINA: normal appearing vagina with normal color and discharge, no lesions. CERVIX: normal appearing cervix without lesions, cervical motion tenderness absent, cervical os closed without purulent discharge;  vaginal discharge scant white Wet prep and DNA probe for chlamydia and GC obtained.   ADNEXA: normal adnexa in size, no palpable masses, mild left adnexal tenderness, no right sided tenderness. UTERUS: uterus is normal size, shape, consistency and nontender.  Musculoskeletal: Normal range of motion.  Skin:    General:  Skin is warm and dry.  Neurological:     Mental Status: She is alert and oriented to person, place, and time.  Psychiatric:        Behavior: Behavior normal.    ED Treatments / Results  Labs (all labs ordered are listed, but only abnormal results are displayed) Labs Reviewed  WET PREP, GENITAL - Abnormal; Notable for the following components:      Result Value   Trich, Wet Prep PRESENT (*)    Clue Cells Wet Prep HPF POC PRESENT (*)    WBC, Wet Prep HPF POC MANY (*)    All other components within normal limits  URINALYSIS, ROUTINE W REFLEX MICROSCOPIC - Abnormal; Notable for the following components:   Hgb urine dipstick TRACE (*)    Leukocytes,Ua SMALL (*)    All other components within normal limits  URINALYSIS, MICROSCOPIC (REFLEX) - Abnormal; Notable for the following components:   Bacteria, UA MANY (*)    All other components within normal limits  PREGNANCY, URINE  GC/CHLAMYDIA PROBE AMP ([place]) NOT AT [facility]    EKG None  Radiology US Transvaginal Non-ob  Result Date: 05/07/2018 CLINICAL DATA:  LEFT pelvic pain and pressure for 2 weeks. EXAM: TRANSABDOMINAL AND TRANSVAGINAL ULTRASOUND OF PELVIS DOPPLER ULTRASOUND OF OVARIES TECHNIQUE: Both transabdominal and transvaginal ultrasound examinations of the pelvis were performed. Transabdominal technique was performed for global imaging of the pelvis including uterus, ovaries, adnexal regions, and pelvic cul-de-sac. It was necessary to proceed with endovaginal exam following the transabdominal exam to visualize the adnexa and endometrium. Color and duplex Doppler ultrasound was utilized to evaluate blood flow  to the ovaries. COMPARISON:  None. FINDINGS: Uterus Measurements: 8.2 x 4.9 x 4.8 cm = volume: 10 mL. No fibroids or other mass visualized. Mildly heterogeneous myometrium. Endometrium Thickness: 4 mm.  No focal abnormality visualized. Right ovary Measurements: 4.8 x 2.1 x 4.1 cm = volume: 22 mL. 2 cm intra-ovarian hemorrhagic follicle with lace-like internal echoes. Left ovary Measurements: 4.9 x 2.3 x 3.9 cm = volume: 23 mL. Normal appearance/no adnexal mass. Pulsed Doppler evaluation of both ovaries demonstrates normal low-resistance arterial and venous waveforms. Other findings No abnormal free fluid. IMPRESSION: 1. No identified etiology for LEFT pelvic pain. 2. 2 cm hemorrhagic RIGHT ovarian cyst/follicle. No indicated follow-up. This recommendation follows the consensus statement: Management of Asymptomatic Ovarian and Other Adnexal Cysts Imaged at [place]: Society of Radiologists in Ultrasound Consensus Conference Statement. Radiology 2010; [id number]. Electronically Signed   By: Awilda Metro M.D.   On: 05/07/2018 14:46   US Pelvis Complete  Result Date: 05/07/2018 CLINICAL DATA:  LEFT pelvic pain and pressure for 2 weeks. EXAM: TRANSABDOMINAL AND TRANSVAGINAL ULTRASOUND OF PELVIS DOPPLER ULTRASOUND OF OVARIES TECHNIQUE: Both transabdominal and transvaginal ultrasound examinations of the pelvis were performed. Transabdominal technique was performed for global imaging of the pelvis including uterus, ovaries, adnexal regions, and pelvic cul-de-sac. It was necessary to proceed with endovaginal exam following the transabdominal exam to visualize the adnexa and endometrium. Color and duplex Doppler ultrasound was utilized to evaluate blood flow to the ovaries. COMPARISON:  None. FINDINGS: Uterus Measurements: 8.2 x 4.9 x 4.8 cm = volume: 10 mL. No fibroids or other mass visualized. Mildly heterogeneous myometrium. Endometrium Thickness: 4 mm.  No focal abnormality visualized. Right ovary Measurements: 4.8 x  2.1 x 4.1 cm = volume: 22 mL. 2 cm intra-ovarian hemorrhagic follicle with lace-like internal echoes. Left ovary Measurements: 4.9 x 2.3 x 3.9 cm = volume: 23 mL.  Normal appearance/no adnexal mass. Pulsed Doppler evaluation of both ovaries demonstrates normal low-resistance arterial and venous waveforms. Other findings No abnormal free fluid. IMPRESSION: 1. No identified etiology for LEFT pelvic pain. 2. 2 cm hemorrhagic RIGHT ovarian cyst/follicle. No indicated follow-up. This recommendation follows the consensus statement: Management of Asymptomatic Ovarian and Other Adnexal Cysts Imaged at [place]: Society of Radiologists in Ultrasound Consensus Conference Statement. Radiology 2010; [phone]. Electronically Signed   By: Awilda Metroourtnay  Bloomer M.D.   On: 05/07/2018 14:46   [place] Art/ven Flow Abd Pelv Doppler  Result Date: 05/07/2018 CLINICAL DATA:  LEFT pelvic pain and pressure for 2 weeks. EXAM: TRANSABDOMINAL AND TRANSVAGINAL ULTRASOUND OF PELVIS DOPPLER ULTRASOUND OF OVARIES TECHNIQUE: Both transabdominal and transvaginal ultrasound examinations of the pelvis were performed. Transabdominal technique was performed for global imaging of the pelvis including uterus, ovaries, adnexal regions, and pelvic cul-de-sac. It was necessary to proceed with endovaginal exam following the transabdominal exam to visualize the adnexa and endometrium. Color and duplex Doppler ultrasound was utilized to evaluate blood flow to the ovaries. COMPARISON:  None. FINDINGS: Uterus Measurements: 8.2 x 4.9 x 4.8 cm = volume: 10 mL. No fibroids or other mass visualized. Mildly heterogeneous myometrium. Endometrium Thickness: 4 mm.  No focal abnormality visualized. Right ovary Measurements: 4.8 x 2.1 x 4.1 cm = volume: 22 mL. 2 cm intra-ovarian hemorrhagic follicle with lace-like internal echoes. Left ovary Measurements: 4.9 x 2.3 x 3.9 cm = volume: 23 mL. Normal appearance/no adnexal mass. Pulsed Doppler evaluation of both ovaries demonstrates  normal low-resistance arterial and venous waveforms. Other findings No abnormal free fluid. IMPRESSION: 1. No identified etiology for LEFT pelvic pain. 2. 2 cm hemorrhagic RIGHT ovarian cyst/follicle. No indicated follow-up. This recommendation follows the consensus statement: Management of Asymptomatic Ovarian and Other Adnexal Cysts Imaged at [place]: Society of Radiologists in Ultrasound Consensus Conference Statement. Radiology 2010; [phone]:[id number]. Electronically Signed   By: Awilda Metroourtnay  Bloomer M.D.   On: 05/07/2018 14:46    Procedures Procedures (including critical care time)  Medications Ordered in ED Medications  cefTRIAXone (ROCEPHIN) injection 250 mg (250 mg Intramuscular Given 05/07/18 1436)  lidocaine (PF) (XYLOCAINE) 1 % injection (5 mLs  Given 05/07/18 1436)     Initial Impression / Assessment and Plan / ED Course  I have reviewed the triage vital signs and the nursing notes.  Pertinent labs & imaging results that were available during my care of the patient were reviewed by me and considered in my medical decision making (see chart for details).    28 year old female presents today for concern of sexually transmitted diseases and mild pelvic pain x2 weeks.  Patient overall well-appearing, vital signs stable.  Pelvic exam today reveals very mild left adnexal tenderness and scant discharge.  Urine pregnancy negative Urinalysis with hemoglobin and small leukocytes, nitrite negative Wet prep positive for trichomoniasis, clue cells and white blood cells GC/Chlamydia pending HIV/RPR refused, she wishes to schedule this with health department - Ultrasound pelvis, transvaginal, Doppler:  IMPRESSION:  1. No identified etiology for LEFT pelvic pain.  2. 2 cm hemorrhagic RIGHT ovarian cyst/follicle. No indicated  follow-up. This recommendation follows the consensus statement:  Management of Asymptomatic Ovarian and Other Adnexal Cysts Imaged at  [place]: Society of Radiologists in Ultrasound  Consensus Conference  Statement. Radiology 2010; [phone].  - Suspect patient's vague left adnexal pain secondary to PID today.  She was treated with Rocephin 250 mg IM.  Prescription given for doxycycline twice daily x2 weeks for PID in addition Flagyl twice daily  x2 weeks given for PID/BV/trichomoniasis.  Patient informed not to drink alcohol while on Flagyl.  Patient informed of all findings including incidental today and need for primary care and OB/GYN follow-up for recheck.  I have given her extensive return precautions and informed her if symptoms do not improve in 3 days she should present for recheck. Patient educated to inform all sexual partners of diagnoses, need for testing and treatment.  Patient informed about safe sex.  Patient encouraged to follow-up at health department for future STD checks.  Patient informed to ensure partners are tested and treated and symptom-free prior to engaging in sexual intercourse. Patient afebrile, not tachycardic, not hypotensive with SPO2 of 100% on room air.  She seems appropriate for outpatient management of her PID today.  Patient discharged in good condition, ambulatory, nontoxic.  At this time there does not appear to be any evidence of an acute emergency medical condition and the patient appears stable for discharge with appropriate outpatient follow up. Diagnosis was discussed with patient who verbalizes understanding of care plan and is agreeable to discharge. I have discussed return precautions with patient who verbalizes understanding of return precautions. Patient strongly encouraged to follow-up with their PCP and OBGYN within one week. All questions answered.  Patient's case and imaging discussed with Dr. Jacqulyn Bath who agrees with plan to discharge with follow-up and doxycycline/flagyl.   Note: Portions of this report may have been transcribed using voice recognition software. Every effort was made to ensure accuracy; however, inadvertent  computerized transcription errors may still be present. Final Clinical Impressions(s) / ED Diagnoses   Final diagnoses:  Pelvic inflammatory disease (PID)  BV (bacterial vaginosis)  Trichimoniasis    ED Discharge Orders         Ordered    doxycycline (VIBRAMYCIN) 100 MG capsule  2 times daily     05/07/18 1529    metroNIDAZOLE (FLAGYL) 500 MG tablet  2 times daily     05/07/18 1529           Elizabeth Palau 05/07/18 1618    Maia Plan, MD 05/08/18 1038

## 2018-05-07 NOTE — Discharge Instructions (Signed)
You have been diagnosed today with Pelvic Inflammatory Disease, Bacterial Vaginosis, Trichimoniasis.  At this time there does not appear to be the presence of an emergent medical condition, however there is always the potential for conditions to change. Please read and follow the below instructions.  Please return to the Emergency Department immediately for any new or worsening symptoms or if your symptoms do not improve within 3 days. Please be sure to follow up with your Primary Care Provider within one week regarding your visit today; please call their office to schedule an appointment even if you are feeling better for a follow-up visit. Please take the medication doxycycline and Flagyl as prescribed for the next 2 weeks to treat your bacterial vaginosis, trichomoniasis and pelvic inflammatory disease.  Do not engage in sexual activity until you have completed treatment and are symptom-free. Do not drink alcohol while taking Flagyl as it will make you feel sick and vomit. Your test for gonorrhea, chlamydia will result in 2-3 days.  Please be sure that all of your partners are tested and treated for STDs in the abdomen is symptom-free prior to engaging in sexual contact. A cyst was noted on your right ovary.  Please call your OB/GYN to schedule a follow-up appointment regarding your visit today.  You may call them today to schedule his appointment for next week.  If you do not have an OB/GYN called the women's health clinic on your discharge paperwork to schedule an appointment.  Get help right away if: You have more belly (abdominal) or lower belly pain. You have chills. You are not better after 72 hours. You have more fluid (discharge) coming from your vagina or fluid that is not normal. Your pain does not improve. You throw up (vomit). You have a fever. You cannot take your medicines. Your partner has a sexually transmitted disease (STD). You have pain when you pee (urinate).  Please read  the additional information packets attached to your discharge summary.  Do not take your medicine if  develop an itchy rash, swelling in your mouth or lips, or difficulty breathing.

## 2018-05-07 NOTE — ED Triage Notes (Addendum)
Reports pelvic pain x 2 weeks.  Denies dysuria, hematuria, discharge, N/V/D.  Patient LWBS yesterday.

## 2018-05-08 LAB — GC/CHLAMYDIA PROBE AMP (~~LOC~~) NOT AT ARMC
Chlamydia: NEGATIVE
Neisseria Gonorrhea: NEGATIVE

## 2018-08-04 ENCOUNTER — Encounter (HOSPITAL_BASED_OUTPATIENT_CLINIC_OR_DEPARTMENT_OTHER): Payer: Self-pay | Admitting: Emergency Medicine

## 2018-08-04 ENCOUNTER — Other Ambulatory Visit: Payer: Self-pay

## 2018-08-04 ENCOUNTER — Emergency Department (HOSPITAL_BASED_OUTPATIENT_CLINIC_OR_DEPARTMENT_OTHER)
Admission: EM | Admit: 2018-08-04 | Discharge: 2018-08-04 | Disposition: A | Discharge status: Home / Self Care | Payer: BLUE CROSS/BLUE SHIELD | Attending: Emergency Medicine | Admitting: Emergency Medicine

## 2018-08-04 DIAGNOSIS — X58XXXA Exposure to other specified factors, initial encounter: Secondary | ICD-10-CM | POA: Diagnosis not present

## 2018-08-04 DIAGNOSIS — T192XXA Foreign body in vulva and vagina, initial encounter: Secondary | ICD-10-CM | POA: Diagnosis not present

## 2018-08-04 DIAGNOSIS — Y9389 Activity, other specified: Secondary | ICD-10-CM | POA: Insufficient documentation

## 2018-08-04 DIAGNOSIS — Y999 Unspecified external cause status: Secondary | ICD-10-CM | POA: Diagnosis not present

## 2018-08-04 DIAGNOSIS — Z87891 Personal history of nicotine dependence: Secondary | ICD-10-CM | POA: Diagnosis not present

## 2018-08-04 DIAGNOSIS — Y929 Unspecified place or not applicable: Secondary | ICD-10-CM | POA: Insufficient documentation

## 2018-08-04 DIAGNOSIS — R102 Pelvic and perineal pain: Secondary | ICD-10-CM | POA: Diagnosis present

## 2018-08-04 NOTE — Discharge Instructions (Signed)
You had a tampon removed today that was stuck in your vagina.  The pain you were having should just go away but you can use ibuprofen or tylenol as needed.  Things should take care of themselves.  Avoid douching and if you do start bleeding avoid using any more tampons this cycle.

## 2018-08-04 NOTE — ED Provider Notes (Signed)
[facility] EMERGENCY DEPARTMENT Provider Note   CSN: [id number] Arrival date & time: 08/04/18  [id number]    History   Chief Complaint Chief Complaint  Patient presents with  . Foreign Body Vagina    HPI Alexandra Nguyen is a 28 y.o. female.     Patient is a 28 year old female with a history of anemia who is presenting today with pelvic pain.  She states the pain started yesterday but she thinks she may have a tampon retained in her vagina.  Her menses ended 2 days ago but she states that she was never able to get the tampon out.  Yesterday she started having discomfort when she sat certain ways or moved her legs.  The pain worsened overnight and she is also noticing a smell.  The pain is mostly in her vaginal area but will occasionally shoot into her back.  She has had no discharge or urinary issues.  She was treated for an STI in February of this year but states since treatment she is not been sexually active and has no symptoms of vaginal discharge or other concerns today.  The history is provided by the patient.    Past Medical History:  Diagnosis Date  . Allergy   . Anemia     Patient Active Problem List   Diagnosis Date Noted  . Spontaneous vaginal delivery 03/01/2015  . Active labor 02/28/2015  . Near syncope 08/07/2014  . Dehydration, mild 08/07/2014  . Nausea and vomiting during pregnancy prior to [redacted] weeks gestation 08/07/2014  . Gonorrhea affecting pregnancy in first trimester 08/07/2014    Past Surgical History:  Procedure Laterality Date  . NO PAST SURGERIES       OB History    Gravida  2   Para  1   Term  1   Preterm  0   AB  1   Living  1     SAB  0   TAB  1   Ectopic  0   Multiple  0   Live Births  1            Home Medications    Prior to Admission medications   Medication Sig Start Date End Date Taking? Authorizing Provider  ferrous sulfate 325 (65 FE) MG tablet Take 1 tablet (325 mg total) by mouth daily with  breakfast. 03/02/15   Marlow Baarslark, Dyanna, MD  ibuprofen (ADVIL,MOTRIN) 600 MG tablet Take 1 tablet (600 mg total) by mouth every 6 (six) hours. 03/02/15   Marlow Baarslark, Dyanna, MD  norgestimate-ethinyl estradiol (SPRINTEC 28) 0.25-35 MG-MCG tablet Take 1 tablet by mouth daily. 09/16/16   Mesner, Barbara CowerJason, MD  norgestimate-ethinyl estradiol (SPRINTEC 28) 0.25-35 MG-MCG tablet Take 1 tablet by mouth daily. 09/16/16   Mesner, Barbara CowerJason, MD    Family History Family History  Problem Relation Age of Onset  . Alcohol abuse Neg Hx   . Asthma Neg Hx   . Arthritis Neg Hx   . Birth defects Neg Hx   . Cancer Neg Hx   . COPD Neg Hx   . Depression Neg Hx   . Diabetes Neg Hx   . Drug abuse Neg Hx   . Early death Neg Hx   . Hearing loss Neg Hx   . Heart disease Neg Hx   . Hyperlipidemia Neg Hx   . Hypertension Neg Hx   . Kidney disease Neg Hx   . Learning disabilities Neg Hx   . Mental illness Neg Hx   .  Mental retardation Neg Hx   . Miscarriages / Stillbirths Neg Hx   . Stroke Neg Hx   . Varicose Veins Neg Hx   . Vision loss Neg Hx     Social History Social History   Tobacco Use  . Smoking status: Former Smoker    Packs/day: 0.50    Years: 4.00    Pack years: 2.00    Types: E-cigarettes, Cigarettes    Last attempt to quit: 03/26/2018    Years since quitting: 0.3  . Smokeless tobacco: Never Used  Substance Use Topics  . Alcohol use: No  . Drug use: No     Allergies   A & d [dimethicone-zinc oxide-vit a-d] and Fish allergy   Review of Systems Review of Systems  All other systems reviewed and are negative.    Physical Exam Updated Vital Signs BP 110/73 (BP Location: Right Arm)   Pulse 72   Temp 99.2 F (37.3 C) (Oral)   Resp 18   Ht 5\' 8"  (1.727 m)   Wt 86.2 kg   LMP 07/28/2018   SpO2 100%   BMI 28.89 kg/m   Physical Exam Vitals signs and nursing note reviewed.  Constitutional:      General: She is not in acute distress.    Appearance: Normal appearance. She is normal weight.   HENT:     Head: Normocephalic.  Cardiovascular:     Rate and Rhythm: Normal rate.     Pulses: Normal pulses.  Pulmonary:     Effort: Pulmonary effort is normal.  Abdominal:     General: Abdomen is flat. There is no distension.     Tenderness: There is no abdominal tenderness. There is no guarding.  Genitourinary:    Vagina: Foreign body present. No vaginal discharge.     Cervix: No cervical motion tenderness, friability, erythema or cervical bleeding.     Uterus: Normal.      Adnexa: Right adnexa normal and left adnexa normal.     Comments: Tampon present in the vaginal vault Skin:    General: Skin is warm and dry.     Capillary Refill: Capillary refill takes less than 2 seconds.  Neurological:     General: No focal deficit present.     Mental Status: She is alert. Mental status is at baseline.  Psychiatric:        Mood and Affect: Mood normal.        Thought Content: Thought content normal.      ED Treatments / Results  Labs (all labs ordered are listed, but only abnormal results are displayed) Labs Reviewed - No data to display  EKG None  Radiology No results found.  Procedures .Foreign Body Removal Date/Time: 08/04/2018 10:05 AM Performed by: Gwyneth Sprout, MD Authorized by: Gwyneth Sprout, MD  Consent: Verbal consent obtained. Risks and benefits: risks, benefits and alternatives were discussed Consent given by: patient Patient understanding: patient states understanding of the procedure being performed Patient identity confirmed: verbally with patient Body area: vagina  Sedation: Patient sedated: no  Patient restrained: no Patient cooperative: yes Localization method: speculum Removal mechanism: forceps Complexity: simple 1 objects recovered. Objects recovered: tampon Post-procedure assessment: foreign body removed Patient tolerance: Patient tolerated the procedure well with no immediate complications   (including critical care time)   Medications Ordered in ED Medications - No data to display   Initial Impression / Assessment and Plan / ED Course  I have reviewed the triage vital signs and the nursing notes.  Pertinent labs & imaging results that were available during my care of the patient were reviewed by me and considered in my medical decision making (see chart for details).        Patient presenting today because she is concerned that there is a tampon retained in her vagina and she is now having discomfort.  Low suspicion for STI or PID at this time.  She has no herpes lesions and on exam she does have a retained tampon.  Tampon was removed and the rest of exam was within normal limits.  Final Clinical Impressions(s) / ED Diagnoses   Final diagnoses:  Foreign body in vagina, initial encounter    ED Discharge Orders    None       Gwyneth Sprout, MD 08/04/18 1007

## 2018-08-04 NOTE — ED Triage Notes (Signed)
Pt thinks tampon is in vagina; denies discharge; sts was treated for STI in Feb and has not been sexually active since

## 2018-08-14 ENCOUNTER — Other Ambulatory Visit: Payer: Self-pay

## 2018-08-14 ENCOUNTER — Encounter (HOSPITAL_BASED_OUTPATIENT_CLINIC_OR_DEPARTMENT_OTHER): Payer: Self-pay | Admitting: Emergency Medicine

## 2018-08-14 ENCOUNTER — Emergency Department (HOSPITAL_BASED_OUTPATIENT_CLINIC_OR_DEPARTMENT_OTHER)
Admission: EM | Admit: 2018-08-14 | Discharge: 2018-08-14 | Disposition: A | Discharge status: Home / Self Care | Payer: BLUE CROSS/BLUE SHIELD | Attending: Emergency Medicine | Admitting: Emergency Medicine

## 2018-08-14 DIAGNOSIS — N760 Acute vaginitis: Secondary | ICD-10-CM | POA: Insufficient documentation

## 2018-08-14 DIAGNOSIS — R102 Pelvic and perineal pain: Secondary | ICD-10-CM | POA: Diagnosis present

## 2018-08-14 DIAGNOSIS — Z87891 Personal history of nicotine dependence: Secondary | ICD-10-CM | POA: Insufficient documentation

## 2018-08-14 DIAGNOSIS — Z79899 Other long term (current) drug therapy: Secondary | ICD-10-CM | POA: Insufficient documentation

## 2018-08-14 DIAGNOSIS — B9689 Other specified bacterial agents as the cause of diseases classified elsewhere: Secondary | ICD-10-CM | POA: Diagnosis not present

## 2018-08-14 LAB — URINALYSIS, MICROSCOPIC (REFLEX)

## 2018-08-14 LAB — WET PREP, GENITAL
Sperm: NONE SEEN
Trich, Wet Prep: NONE SEEN
Yeast Wet Prep HPF POC: NONE SEEN

## 2018-08-14 LAB — URINALYSIS, ROUTINE W REFLEX MICROSCOPIC
Glucose, UA: NEGATIVE mg/dL
Ketones, ur: NEGATIVE mg/dL
Leukocytes,Ua: NEGATIVE
Nitrite: NEGATIVE
Protein, ur: NEGATIVE mg/dL
Specific Gravity, Urine: >1.03 — ABNORMAL HIGH (ref 1.005–1.030)
pH: 6 (ref 5.0–8.0)

## 2018-08-14 LAB — PREGNANCY, URINE: Preg Test, Ur: NEGATIVE

## 2018-08-14 MED ORDER — HYDROCODONE-ACETAMINOPHEN 5-325 MG PO TABS
1.0000 | ORAL_TABLET | Freq: Once | ORAL | Status: AC
  Administered 2018-08-14: 1 via ORAL
  Filled 2018-08-14: qty 1

## 2018-08-14 MED ORDER — METRONIDAZOLE 500 MG PO TABS: 500.0000 mg | ORAL_TABLET | Freq: Two times a day (BID) | ORAL | 0 refills | Status: DC

## 2018-08-14 MED FILL — metroNIDAZOLE 500 MG TABS: 500 | 7 days supply | Qty: 14 | Fill #0

## 2018-08-14 NOTE — ED Provider Notes (Signed)
[facility] EMERGENCY DEPARTMENT Provider Note   CSN: [id number] Arrival date & time: 08/14/18  0454    History   Chief Complaint Chief Complaint  Patient presents with  . Pelvic Pain    HPI Alexandra Nguyen is a 28 y.o. female.     The history is provided by the patient.  Pelvic Pain  This is a new problem. The current episode started more than 1 week ago. The problem occurs daily. The problem has been gradually worsening. Pertinent negatives include no abdominal pain. Nothing aggravates the symptoms. Nothing relieves the symptoms.  Patient presents with pelvic pain.  She reports she was seen in the ER on May 11th for retained tampon in her vagina.  That was successfully removed, but since that time she has had  lower pelvic pain that will radiate into her back.  No fever/vomiting.  No dysuria.  No vaginal bleeding or discharge.  She has had sexual intercourse in the past week without difficulty  Past Medical History:  Diagnosis Date  . Allergy   . Anemia     Patient Active Problem List   Diagnosis Date Noted  . Spontaneous vaginal delivery 03/01/2015  . Active labor 02/28/2015  . Near syncope 08/07/2014  . Dehydration, mild 08/07/2014  . Nausea and vomiting during pregnancy prior to [redacted] weeks gestation 08/07/2014  . Gonorrhea affecting pregnancy in first trimester 08/07/2014    Past Surgical History:  Procedure Laterality Date  . NO PAST SURGERIES       OB History    Gravida  2   Para  1   Term  1   Preterm  0   AB  1   Living  1     SAB  0   TAB  1   Ectopic  0   Multiple  0   Live Births  1            Home Medications    Prior to Admission medications   Medication Sig Start Date End Date Taking? Authorizing Provider  ferrous sulfate 325 (65 FE) MG tablet Take 1 tablet (325 mg total) by mouth daily with breakfast. 03/02/15   Marlow Baarslark, Dyanna, MD  ibuprofen (ADVIL,MOTRIN) 600 MG tablet Take 1 tablet (600 mg total) by mouth every  6 (six) hours. 03/02/15   Marlow Baarslark, Dyanna, MD  norgestimate-ethinyl estradiol (SPRINTEC 28) 0.25-35 MG-MCG tablet Take 1 tablet by mouth daily. 09/16/16   Mesner, Barbara CowerJason, MD  norgestimate-ethinyl estradiol (SPRINTEC 28) 0.25-35 MG-MCG tablet Take 1 tablet by mouth daily. 09/16/16   Mesner, Barbara CowerJason, MD    Family History Family History  Problem Relation Age of Onset  . Alcohol abuse Neg Hx   . Asthma Neg Hx   . Arthritis Neg Hx   . Birth defects Neg Hx   . Cancer Neg Hx   . COPD Neg Hx   . Depression Neg Hx   . Diabetes Neg Hx   . Drug abuse Neg Hx   . Early death Neg Hx   . Hearing loss Neg Hx   . Heart disease Neg Hx   . Hyperlipidemia Neg Hx   . Hypertension Neg Hx   . Kidney disease Neg Hx   . Learning disabilities Neg Hx   . Mental illness Neg Hx   . Mental retardation Neg Hx   . Miscarriages / Stillbirths Neg Hx   . Stroke Neg Hx   . Varicose Veins Neg Hx   . Vision loss Neg  Hx     Social History Social History   Tobacco Use  . Smoking status: Former Smoker    Packs/day: 0.50    Years: 4.00    Pack years: 2.00    Types: E-cigarettes, Cigarettes    Last attempt to quit: 03/26/2018    Years since quitting: 0.3  . Smokeless tobacco: Never Used  Substance Use Topics  . Alcohol use: No  . Drug use: No     Allergies   A & d [dimethicone-zinc oxide-vit a-d] and Fish allergy   Review of Systems Review of Systems  Constitutional: Negative for fever.  Gastrointestinal: Negative for abdominal pain and vomiting.  Genitourinary: Positive for pelvic pain. Negative for dyspareunia, dysuria, vaginal bleeding and vaginal discharge.  All other systems reviewed and are negative.    Physical Exam Updated Vital Signs BP (!) 131/92 (BP Location: Right Arm)   Pulse 60   Temp 98.1 F (36.7 C) (Oral)   Resp 18   Ht 1.727 m (5\' 8" )   Wt 86 kg   LMP 07/28/2018   SpO2 100%   BMI 28.83 kg/m   Physical Exam CONSTITUTIONAL: Well developed/well nourished HEAD:  Normocephalic/atraumatic EYES: EOMI/PERRL ENMT: Mucous membranes moist NECK: supple no meningeal signs CV: S1/S2 noted, no murmurs/rubs/gallops noted LUNGS: Lungs are clear to auscultation bilaterally, no apparent distress ABDOMEN: soft, nontender, no rebound or guarding, bowel sounds noted throughout abdomen GU:no cva tenderness , no CMT, no vag bleeding, no bruising, no vulvar lesions, no foreign bodies noted, small amt of discharge noted, nurse meredith present for exam NEURO: Pt is awake/alert/appropriate, moves all extremitiesx4.  No facial droop.   EXTREMITIES: pulses normal/equal, full ROM SKIN: warm, color normal PSYCH: no abnormalities of mood noted, alert and oriented to situation   ED Treatments / Results  Labs (all labs ordered are listed, but only abnormal results are displayed) Labs Reviewed  WET PREP, GENITAL - Abnormal; Notable for the following components:      Result Value   Clue Cells Wet Prep HPF POC PRESENT (*)    WBC, Wet Prep HPF POC MANY (*)    All other components within normal limits  URINALYSIS, ROUTINE W REFLEX MICROSCOPIC - Abnormal; Notable for the following components:   APPearance CLOUDY (*)    Specific Gravity, Urine >1.030 (*)    Hgb urine dipstick SMALL (*)    Bilirubin Urine SMALL (*)    All other components within normal limits  URINALYSIS, MICROSCOPIC (REFLEX) - Abnormal; Notable for the following components:   Bacteria, UA MANY (*)    All other components within normal limits  PREGNANCY, URINE  GC/CHLAMYDIA PROBE AMP ([employer]) NOT AT [facility]    EKG None  Radiology No results found.  Procedures Procedures    Medications Ordered in ED Medications  HYDROcodone-acetaminophen (NORCO/VICODIN) 5-325 MG per tablet 1 tablet (1 tablet Oral Given 08/14/18 0527)     Initial Impression / Assessment and Plan / ED Course  I have reviewed the triage vital signs and the nursing notes.  Pertinent labs   results that were available during my  care of the patient were reviewed by me and considered in my medical decision making (see chart for details).        Pt with Pelvic pain.  She was found to have bacterial vaginosis.  She is otherwise well-appearing.  Final Clinical Impressions(s) / ED Diagnoses   Final diagnoses:  Pelvic pain in female  Bacterial vaginosis    ED Discharge Orders  Ordered    metroNIDAZOLE (FLAGYL) 500 MG tablet  2 times daily     08/14/18 0541           Zadie Rhine, MD 08/14/18 [id number]

## 2018-08-14 NOTE — ED Triage Notes (Signed)
Patient presents with complaints of lower pelvic pain x 3 days; states seen here on 5/11 for similar complaints. Denies vaginal discharge or bleeding at this time.

## 2018-08-15 LAB — GC/CHLAMYDIA PROBE AMP (~~LOC~~) NOT AT ARMC
Chlamydia: NEGATIVE
Neisseria Gonorrhea: NEGATIVE

## 2019-01-08 ENCOUNTER — Encounter (HOSPITAL_BASED_OUTPATIENT_CLINIC_OR_DEPARTMENT_OTHER): Payer: Self-pay

## 2019-01-08 ENCOUNTER — Emergency Department (HOSPITAL_BASED_OUTPATIENT_CLINIC_OR_DEPARTMENT_OTHER)
Admission: EM | Admit: 2019-01-08 | Discharge: 2019-01-08 | Disposition: A | Discharge status: Home / Self Care | Payer: BLUE CROSS/BLUE SHIELD | Attending: Emergency Medicine | Admitting: Emergency Medicine

## 2019-01-08 ENCOUNTER — Other Ambulatory Visit: Payer: Self-pay

## 2019-01-08 DIAGNOSIS — R102 Pelvic and perineal pain: Secondary | ICD-10-CM | POA: Diagnosis present

## 2019-01-08 DIAGNOSIS — Z202 Contact with and (suspected) exposure to infections with a predominantly sexual mode of transmission: Secondary | ICD-10-CM | POA: Insufficient documentation

## 2019-01-08 DIAGNOSIS — Z87891 Personal history of nicotine dependence: Secondary | ICD-10-CM | POA: Insufficient documentation

## 2019-01-08 DIAGNOSIS — Z883 Allergy status to other anti-infective agents status: Secondary | ICD-10-CM | POA: Insufficient documentation

## 2019-01-08 DIAGNOSIS — B9689 Other specified bacterial agents as the cause of diseases classified elsewhere: Secondary | ICD-10-CM

## 2019-01-08 DIAGNOSIS — Z711 Person with feared health complaint in whom no diagnosis is made: Secondary | ICD-10-CM

## 2019-01-08 DIAGNOSIS — N760 Acute vaginitis: Secondary | ICD-10-CM | POA: Insufficient documentation

## 2019-01-08 LAB — URINALYSIS, MICROSCOPIC (REFLEX)

## 2019-01-08 LAB — PREGNANCY, URINE: Preg Test, Ur: NEGATIVE

## 2019-01-08 LAB — URINALYSIS, ROUTINE W REFLEX MICROSCOPIC
Bilirubin Urine: NEGATIVE
Glucose, UA: NEGATIVE mg/dL
Ketones, ur: NEGATIVE mg/dL
Leukocytes,Ua: NEGATIVE
Nitrite: NEGATIVE
Protein, ur: NEGATIVE mg/dL
Specific Gravity, Urine: 1.025 (ref 1.005–1.030)
pH: 6.5 (ref 5.0–8.0)

## 2019-01-08 LAB — WET PREP, GENITAL
Sperm: NONE SEEN
Trich, Wet Prep: NONE SEEN
Yeast Wet Prep HPF POC: NONE SEEN

## 2019-01-08 LAB — HIV ANTIBODY (ROUTINE TESTING W REFLEX): HIV Screen 4th Generation wRfx: NONREACTIVE

## 2019-01-08 MED ORDER — METRONIDAZOLE 500 MG PO TABS: 500.0000 mg | ORAL_TABLET | Freq: Two times a day (BID) | ORAL | 0 refills | Status: AC

## 2019-01-08 MED ORDER — FLUCONAZOLE 150 MG PO TABS: 150.0000 mg | ORAL_TABLET | Freq: Once | ORAL | 0 refills | Status: AC

## 2019-01-08 MED ORDER — CEFTRIAXONE SODIUM 250 MG IJ SOLR
250.0000 mg | Freq: Once | INTRAMUSCULAR | Status: AC
  Administered 2019-01-08: 250 mg via INTRAMUSCULAR
  Filled 2019-01-08: qty 250

## 2019-01-08 MED ORDER — AZITHROMYCIN 250 MG PO TABS
1000.0000 mg | ORAL_TABLET | Freq: Once | ORAL | Status: AC
  Administered 2019-01-08: 1000 mg via ORAL
  Filled 2019-01-08: qty 4

## 2019-01-08 NOTE — Discharge Instructions (Addendum)
You have been diagnosed today with bacterial vaginosis and concern for STD.Marland Kitchen  At this time there does not appear to be the presence of an emergent medical condition, however there is always the potential for conditions to change. Please read and follow the below instructions.  Please return to the Emergency Department immediately for any new or worsening symptoms. Please be sure to follow up with your Primary Care Provider within one week regarding your visit today; please call their office to schedule an appointment even if you are feeling better for a follow-up visit. Please take the medication Flagyl as prescribed 1 pill twice a day for 7 days for treatment of bacterial vaginosis.  Do not drink alcohol while taking this medication as will make you sick and vomit. You have been treated presumptively today for gonorrhea and chlamydia. You have been tested today for gonorrhea and chlamydia as well as HIV and syphilis. These results will be available in approximately 3 days. You may check your MyChart account for results. Please inform all sexual partners of positive results and that they should be tested and treated as well. Please wait 2 weeks and be sure that you and your partners are symptom free before returning to sexual activity. Please use protection with every sexual encounter. Follow Up: Please followup with your primary doctor in 3 days for discussion of your diagnoses and further evaluation after today's visit; if you do not have a primary care doctor use the resource guide provided to find one; Please return to the ER for worsening symptoms, high fevers or persistent vomiting. You may use the medication Diflucan as needed if you develop a of yeast infection. [facility] and follow-up.  Call their office tomorrow to schedule an appointment.  Get help right away if: You have nausea or vomiting You have rash You have headache or vision  changes You have fever or chills You have pain in your chest, belly or pelvis You have any new/concerning or worsening symptoms  Please read the additional information packets attached to your discharge summary.  Do not take your medicine if  develop an itchy rash, swelling in your mouth or lips, or difficulty breathing; call 911 and seek immediate emergency medical attention if this occurs.  Note: Portions of this text may have been transcribed using voice recognition software. Every effort was made to ensure accuracy; however, inadvertent computerized transcription errors may still be present.

## 2019-01-08 NOTE — ED Provider Notes (Signed)
[facility] EMERGENCY DEPARTMENT Provider Note   CSN: [id number] Arrival date & time: 01/08/19  1541     History   Chief Complaint Chief Complaint  Patient presents with  . Pelvic Pain    HPI Alexandra Nguyen is a 28 y.o. female otherwise healthy presents today for concern of STI.  Patient reports that she has had mild pelvic pain and vaginal discharge for 1 week, she is concerned for recurrence of bacterial vaginosis.  She describes an mild pressure sensation constant without aggravating or alleviating factors or radiation.  No medications prior to arrival.  She reports vaginal discharge has a scant thick white/yellow.  She reports that she was sexually active 2 weeks ago without protection.  Unknown STI diagnosis of partners.  Denies fever/chills, headache, chest pain/shortness of breath, abdominal pain, nausea/vomiting, diarrhea, dysuria/hematuria, vaginal bleeding, rash or any additional concerns.    HPI  Past Medical History:  Diagnosis Date  . Allergy   . Anemia     Patient Active Problem List   Diagnosis Date Noted  . Spontaneous vaginal delivery 03/01/2015  . Active labor 02/28/2015  . Near syncope 08/07/2014  . Dehydration, mild 08/07/2014  . Nausea and vomiting during pregnancy prior to [redacted] weeks gestation 08/07/2014  . Gonorrhea affecting pregnancy in first trimester 08/07/2014    Past Surgical History:  Procedure Laterality Date  . NO PAST SURGERIES       OB History    Gravida  2   Para  1   Term  1   Preterm  0   AB  1   Living  1     SAB  0   TAB  1   Ectopic  0   Multiple  0   Live Births  1            Home Medications    Prior to Admission medications   Medication Sig Start Date End Date Taking? Authorizing Provider  ferrous sulfate 325 (65 FE) MG tablet Take 1 tablet (325 mg total) by mouth daily with breakfast. 03/02/15   Marlow Baars, MD  fluconazole (DIFLUCAN) 150 MG tablet Take 1 tablet (150 mg total) by  mouth once for 1 dose. 01/08/19 01/08/19  Harlene Salts A, PA-C  metroNIDAZOLE (FLAGYL) 500 MG tablet Take 1 tablet (500 mg total) by mouth 2 (two) times daily for 7 days. 01/08/19 01/15/19  Harlene Salts A, PA-C  norgestimate-ethinyl estradiol (SPRINTEC 28) 0.25-35 MG-MCG tablet Take 1 tablet by mouth daily. 09/16/16   Mesner, Barbara Cower, MD    Family History Family History  Problem Relation Age of Onset  . Alcohol abuse Neg Hx   . Asthma Neg Hx   . Arthritis Neg Hx   . Birth defects Neg Hx   . Cancer Neg Hx   . COPD Neg Hx   . Depression Neg Hx   . Diabetes Neg Hx   . Drug abuse Neg Hx   . Early death Neg Hx   . Hearing loss Neg Hx   . Heart disease Neg Hx   . Hyperlipidemia Neg Hx   . Hypertension Neg Hx   . Kidney disease Neg Hx   . Learning disabilities Neg Hx   . Mental illness Neg Hx   . Mental retardation Neg Hx   . Miscarriages / Stillbirths Neg Hx   . Stroke Neg Hx   . Varicose Veins Neg Hx   . Vision loss Neg Hx     Social History  Social History   Tobacco Use  . Smoking status: Former Smoker    Packs/day: 0.50    Years: 4.00    Pack years: 2.00    Types: E-cigarettes, Cigarettes    Quit date: 03/26/2018    Years since quitting: 0.7  . Smokeless tobacco: Never Used  Substance Use Topics  . Alcohol use: No  . Drug use: No     Allergies   A & d [dimethicone-zinc oxide-vit a-d] and Fish allergy   Review of Systems Review of Systems Ten systems are reviewed and are negative for acute change except as noted in the HPI   Physical Exam Updated Vital Signs BP (!) 121/92 (BP Location: Right Arm)   Pulse 66   Temp 98.5 F (36.9 C) (Oral)   Resp 16   Ht 5\' 9"  (1.753 m)   Wt 87.1 kg   LMP 12/21/2018   SpO2 100%   BMI 28.35 kg/m   Physical Exam Constitutional:      General: She is not in acute distress.    Appearance: Normal appearance. She is well-developed. She is not ill-appearing or diaphoretic.  HENT:     Head: Normocephalic and atraumatic.      Right Ear: External ear normal.     Left Ear: External ear normal.     Nose: Nose normal.  Eyes:     General: Vision grossly intact. Gaze aligned appropriately.     Pupils: Pupils are equal, round, and reactive to light.  Neck:     Musculoskeletal: Normal range of motion.     Trachea: Trachea and phonation normal. No tracheal deviation.  Pulmonary:     Effort: Pulmonary effort is normal. No respiratory distress.  Abdominal:     General: There is no distension.     Palpations: Abdomen is soft.     Tenderness: There is no abdominal tenderness. There is no guarding or rebound.  Genitourinary:    Comments: Exam chaperoned by Ardine EngJennifer RN.  Pelvic exam: normal external genitalia without evidence of trauma. VULVA: normal appearing vulva with no masses, tenderness or lesion. VAGINA: normal appearing vagina with normal color and discharge, no lesions. CERVIX: normal appearing cervix without lesions, cervical motion tenderness absent, cervical os closed without purulent discharge; vaginal discharge thick white Wet prep and DNA probe for chlamydia and GC obtained.   ADNEXA: normal adnexa in size, nontender and no masses UTERUS: uterus is normal size, shape, consistency and nontender.  Musculoskeletal: Normal range of motion.  Skin:    General: Skin is warm and dry.  Neurological:     Mental Status: She is alert.     GCS: GCS eye subscore is 4. GCS verbal subscore is 5. GCS motor subscore is 6.     Comments: Speech is clear and goal oriented, follows commands Major Cranial nerves without deficit, no facial droop Moves extremities without ataxia, coordination intact  Psychiatric:        Behavior: Behavior normal.    ED Treatments / Results  Labs (all labs ordered are listed, but only abnormal results are displayed) Labs Reviewed  WET PREP, GENITAL - Abnormal; Notable for the following components:      Result Value   Clue Cells Wet Prep HPF POC PRESENT (*)    WBC, Wet Prep HPF POC  MANY (*)    All other components within normal limits  URINALYSIS, ROUTINE W REFLEX MICROSCOPIC - Abnormal; Notable for the following components:   Hgb urine dipstick TRACE (*)  All other components within normal limits  URINALYSIS, MICROSCOPIC (REFLEX) - Abnormal; Notable for the following components:   Bacteria, UA FEW (*)    All other components within normal limits  PREGNANCY, URINE  RPR  HIV ANTIBODY (ROUTINE TESTING W REFLEX)  HIV4GL SAVE TUBE  GC/CHLAMYDIA PROBE AMP ([place]) NOT AT [facility]    EKG None  Radiology No results found.  Procedures Procedures (including critical care time)  Medications Ordered in ED Medications  cefTRIAXone (ROCEPHIN) injection 250 mg (250 mg Intramuscular Given 01/08/19 1933)  azithromycin (ZITHROMAX) tablet 1,000 mg (1,000 mg Oral Given 01/08/19 1932)     Initial Impression / Assessment and Plan / ED Course  I have reviewed the triage vital signs and the nursing notes.  Pertinent labs & imaging results that were available during my care of the patient were reviewed by me and considered in my medical decision making (see chart for details).     Urine pregnancy negative Urinalysis with trace hemoglobin, microscope with 6-10 WBCs, few bacteria, 6-10 squamous cells, mucus - Urinalysis appears contaminated, patient without dysuria, frequency or hesitancy, doubt UTI at this time.  Wet prep with clue cells, WBCs, negative trichomoniasis, negative yeast - Physical examination today consistent with bacterial vaginosis correlates with wet prep.  Patient also concern for STI today. Patient understands that they have GC/Chlamydia cultures pending and that they will need to inform all sexual partners if results return positive. Patient has been treated prophylactically with azithromycin and Rocephin due to patient's history, pelvic exam, and wet prep with increased WBCs. Pt not concerning for PID because hemodynamically stable and no cervical  motion tenderness on pelvic exam. Patient has also been treated with Flagyl for Bacterial Vaginosis. Patient has been advised to not drink alcohol while on this medication. Patient to be discharged with instructions to follow up with OBGYN/PCP. Discussed importance of using protection when sexually active.  Additionally patient without tenderness on pelvic examination, I do not suspect TOA, torsion or other acute pathologies at this time.  Abdomen soft nontender and without peritoneal signs, no nausea, vomiting, diarrhea or abdominal pain, doubt SBO, perforation, appendicitis, diverticulitis or other acute abdominal pelvic etiology of her symptoms today.  No indication for imaging or further work-up at this time.  Patient requests Diflucan as she typically develops yeast infections with antibiotic therapy.  150 mg Diflucan Rx given if she develops symptoms.  Patient given referral to [facility] for health to establish OB/GYN care.  At this time there does not appear to be any evidence of an acute emergency medical condition and the patient appears stable for discharge with appropriate outpatient follow up. Diagnosis was discussed with patient who verbalizes understanding of care plan and is agreeable to discharge. I have discussed return precautions with patient  who verbalizes understanding of return precautions. Patient encouraged to follow-up with their PCP. All questions answered. Patient has been discharged in good condition.   Note: Portions of this report may have been transcribed using voice recognition software. Every effort was made to ensure accuracy; however, inadvertent computerized transcription errors may still be present. Final Clinical Impressions(s) / ED Diagnoses   Final diagnoses:  BV (bacterial vaginosis)  Concern about STD in female without diagnosis    ED Discharge Orders         Ordered    metroNIDAZOLE (FLAGYL) 500 MG tablet  2 times daily     01/08/19 2018     fluconazole (DIFLUCAN) 150 MG tablet   Once  01/08/19 2027           Bill Salinas, PA-C 01/08/19 2030    Vanetta Mulders, MD 01/11/19 [phone]

## 2019-01-08 NOTE — ED Triage Notes (Signed)
Pt c/o pelvic pain that has been going on since Jan. Pt states initially she was diagnosed with trich, then she had a tampon that became stuck, and a third time she had BV. Pt does not state she has had any worsening today, but just wanted to get it checked. Pt did state she has some abx leftover from the last BV diagnosis.

## 2019-01-08 NOTE — ED Notes (Signed)
Patient verbalizes understanding of discharge instructions. Opportunity for questioning and answers were provided. Armband removed by staff, pt discharged from ED.  

## 2019-01-08 NOTE — ED Notes (Signed)
ED Provider at bedside. 

## 2019-01-09 LAB — RPR: RPR Ser Ql: NONREACTIVE

## 2019-01-09 MED FILL — METRONIDAZOLE 500 MG TABS: 500 | 7 days supply | Qty: 14 | Fill #0

## 2019-01-09 MED FILL — FLUCONAZOLE 150 MG TABS: 150 | 1 days supply | Qty: 1 | Fill #0

## 2019-01-12 LAB — GC/CHLAMYDIA PROBE AMP (~~LOC~~) NOT AT ARMC
Chlamydia: NEGATIVE
Neisseria Gonorrhea: NEGATIVE

## 2019-01-14 ENCOUNTER — Emergency Department (HOSPITAL_BASED_OUTPATIENT_CLINIC_OR_DEPARTMENT_OTHER)
Admission: EM | Admit: 2019-01-14 | Discharge: 2019-01-14 | Disposition: A | Discharge status: Home / Self Care | Payer: 59 | Attending: Emergency Medicine | Admitting: Emergency Medicine

## 2019-01-14 ENCOUNTER — Encounter (HOSPITAL_BASED_OUTPATIENT_CLINIC_OR_DEPARTMENT_OTHER): Payer: Self-pay

## 2019-01-14 ENCOUNTER — Other Ambulatory Visit: Payer: Self-pay

## 2019-01-14 DIAGNOSIS — R238 Other skin changes: Secondary | ICD-10-CM

## 2019-01-14 DIAGNOSIS — Z79899 Other long term (current) drug therapy: Secondary | ICD-10-CM | POA: Insufficient documentation

## 2019-01-14 DIAGNOSIS — R102 Pelvic and perineal pain: Secondary | ICD-10-CM | POA: Diagnosis present

## 2019-01-14 DIAGNOSIS — Z87891 Personal history of nicotine dependence: Secondary | ICD-10-CM | POA: Diagnosis not present

## 2019-01-14 NOTE — ED Triage Notes (Signed)
Pt states that she recently had a pelvic exam here and has been having pain since with some swelling around her vagina. Pt states she has been using cortisone cream on the area.

## 2019-01-14 NOTE — ED Notes (Signed)
ED Provider at bedside. 

## 2019-01-14 NOTE — Discharge Instructions (Addendum)
Recommend taking anti-inflammatories such as naproxen or ibuprofen for pain control.  Can also try oral Benadryl as needed to help with your symptoms.  Recommend avoiding intercourse for the next week.  If the pain worsens, you develop swelling, fevers or redness, recommend return to ER for recheck.

## 2019-01-14 NOTE — ED Provider Notes (Signed)
[facility] EMERGENCY DEPARTMENT Provider Note   CSN: [id number] Arrival date & time: 01/14/19  0813     History   Chief Complaint Chief Complaint  Patient presents with  . Groin Swelling    HPI Alexandra Nguyen is a 28 y.o. female.  Presents emerged department with chief complaint of pelvic pain, swelling around her vagina after recent pelvic exam.  States she has burning sensation on the skin during urination.  Completed chart review, patient seen in our emergency department on October 15 with concerns for STI.  Pelvic exam was performed.  Work-up was concerning for bacterial vaginosis.  Patient was prescribed Flagyl and Diflucan.  Her GC and Chlamydia tests since have come back negative.     HPI  Past Medical History:  Diagnosis Date  . Allergy   . Anemia     Patient Active Problem List   Diagnosis Date Noted  . Spontaneous vaginal delivery 03/01/2015  . Active labor 02/28/2015  . Near syncope 08/07/2014  . Dehydration, mild 08/07/2014  . Nausea and vomiting during pregnancy prior to [redacted] weeks gestation 08/07/2014  . Gonorrhea affecting pregnancy in first trimester 08/07/2014    Past Surgical History:  Procedure Laterality Date  . NO PAST SURGERIES       OB History    Gravida  2   Para  1   Term  1   Preterm  0   AB  1   Living  1     SAB  0   TAB  1   Ectopic  0   Multiple  0   Live Births  1            Home Medications    Prior to Admission medications   Medication Sig Start Date End Date Taking? Authorizing Provider  ferrous sulfate 325 (65 FE) MG tablet Take 1 tablet (325 mg total) by mouth daily with breakfast. 03/02/15   Marlow Baarslark, Dyanna, MD  metroNIDAZOLE (FLAGYL) 500 MG tablet Take 1 tablet (500 mg total) by mouth 2 (two) times daily for 7 days. 01/08/19 01/15/19  Harlene SaltsMorelli, Brandon A, PA-C  norgestimate-ethinyl estradiol (SPRINTEC 28) 0.25-35 MG-MCG tablet Take 1 tablet by mouth daily. 09/16/16   Mesner, Barbara CowerJason, MD     Family History Family History  Problem Relation Age of Onset  . Alcohol abuse Neg Hx   . Asthma Neg Hx   . Arthritis Neg Hx   . Birth defects Neg Hx   . Cancer Neg Hx   . COPD Neg Hx   . Depression Neg Hx   . Diabetes Neg Hx   . Drug abuse Neg Hx   . Early death Neg Hx   . Hearing loss Neg Hx   . Heart disease Neg Hx   . Hyperlipidemia Neg Hx   . Hypertension Neg Hx   . Kidney disease Neg Hx   . Learning disabilities Neg Hx   . Mental illness Neg Hx   . Mental retardation Neg Hx   . Miscarriages / Stillbirths Neg Hx   . Stroke Neg Hx   . Varicose Veins Neg Hx   . Vision loss Neg Hx     Social History Social History   Tobacco Use  . Smoking status: Former Smoker    Packs/day: 0.50    Years: 4.00    Pack years: 2.00    Types: E-cigarettes, Cigarettes    Quit date: 03/26/2018    Years since quitting: 0.8  . Smokeless  tobacco: Never Used  Substance Use Topics  . Alcohol use: No  . Drug use: No     Allergies   A & d [dimethicone-zinc oxide-vit a-d] and Fish allergy   Review of Systems Review of Systems  Constitutional: Negative for chills and fever.  HENT: Negative for ear pain and sore throat.   Eyes: Negative for pain and visual disturbance.  Respiratory: Negative for cough and shortness of breath.   Cardiovascular: Negative for chest pain and palpitations.  Gastrointestinal: Negative for abdominal pain and vomiting.  Genitourinary: Negative for dysuria and hematuria.  Musculoskeletal: Negative for arthralgias and back pain.  Skin: Negative for color change and rash.  Neurological: Negative for seizures and syncope.  All other systems reviewed and are negative.    Physical Exam Updated Vital Signs Ht 5\' 9"  (1.753 m)   Wt 86.2 kg   LMP 12/21/2018   BMI 28.06 kg/m   Physical Exam Vitals signs and nursing note reviewed.  Constitutional:      General: She is not in acute distress.    Appearance: She is well-developed.  HENT:     Head:  Normocephalic and atraumatic.  Eyes:     Conjunctiva/sclera: Conjunctivae normal.  Neck:     Musculoskeletal: Neck supple.  Cardiovascular:     Rate and Rhythm: Normal rate and regular rhythm.     Heart sounds: No murmur.  Pulmonary:     Effort: Pulmonary effort is normal. No respiratory distress.     Breath sounds: Normal breath sounds.  Abdominal:     Palpations: Abdomen is soft.     Tenderness: There is no abdominal tenderness.  Genitourinary:   Musculoskeletal:        General: No swelling or tenderness.  Skin:    General: Skin is warm and dry.     Capillary Refill: Capillary refill takes less than 2 seconds.  Neurological:     General: No focal deficit present.     Mental Status: She is alert and oriented to person, place, and time.   Chaperoned by RN   ED Treatments / Results  Labs (all labs ordered are listed, but only abnormal results are displayed) Labs Reviewed - No data to display  EKG None  Radiology No results found.  Procedures Procedures (including critical care time)  Medications Ordered in ED Medications - No data to display   Initial Impression / Assessment and Plan / ED Course  I have reviewed the triage vital signs and the nursing notes.  Pertinent labs & imaging results that were available during my care of the patient were reviewed by me and considered in my medical decision making (see chart for details).       29 year old who presents with to ER with pain around her vulva.  Recent pelvic, speculum exam.  On reexamination today, she had some very sensitive skin over the base of her external genitalia.  Did not see any herpetic lesions, there is no palpable abscess, no significant erythema to suggest cellulitis.  Suspect she has some skin irritation, more likely from intercourse but also possibly recent speculum exam.  Also on differential would be a latex allergy. She was just checked for stds, which were all negative, did have BV which she  has been taking flagyl for. No dysuria. Do not feel need to repeat these labs at this time. Recommend short break from intercourse and recheck with PCP as well as trial of latex free condoms.  Reviewed return precautions.  After the discussed management above, the patient was determined to be safe for discharge.  The patient was in agreement with this plan and all questions regarding their care were answered.  ED return precautions were discussed and the patient will return to the ED with any significant worsening of condition.     Final Clinical Impressions(s) / ED Diagnoses   Final diagnoses:  Skin irritation  Pain in vulva    ED Discharge Orders    None       Lucrezia Starch, MD 01/14/19 1517

## 2019-03-23 ENCOUNTER — Encounter (HOSPITAL_BASED_OUTPATIENT_CLINIC_OR_DEPARTMENT_OTHER): Payer: Self-pay | Admitting: Emergency Medicine

## 2019-03-23 ENCOUNTER — Other Ambulatory Visit: Payer: Self-pay

## 2019-03-23 ENCOUNTER — Emergency Department (HOSPITAL_BASED_OUTPATIENT_CLINIC_OR_DEPARTMENT_OTHER)
Admission: EM | Admit: 2019-03-23 | Discharge: 2019-03-24 | Disposition: A | Discharge status: Home / Self Care | Payer: Self-pay | Attending: Emergency Medicine | Admitting: Emergency Medicine

## 2019-03-23 DIAGNOSIS — Z87891 Personal history of nicotine dependence: Secondary | ICD-10-CM | POA: Insufficient documentation

## 2019-03-23 DIAGNOSIS — R519 Headache, unspecified: Secondary | ICD-10-CM | POA: Insufficient documentation

## 2019-03-23 DIAGNOSIS — Y9289 Other specified places as the place of occurrence of the external cause: Secondary | ICD-10-CM | POA: Insufficient documentation

## 2019-03-23 DIAGNOSIS — Y9389 Activity, other specified: Secondary | ICD-10-CM | POA: Insufficient documentation

## 2019-03-23 DIAGNOSIS — Z79899 Other long term (current) drug therapy: Secondary | ICD-10-CM | POA: Insufficient documentation

## 2019-03-23 DIAGNOSIS — Y999 Unspecified external cause status: Secondary | ICD-10-CM | POA: Insufficient documentation

## 2019-03-23 DIAGNOSIS — M5489 Other dorsalgia: Secondary | ICD-10-CM | POA: Insufficient documentation

## 2019-03-23 DIAGNOSIS — T148XXA Other injury of unspecified body region, initial encounter: Secondary | ICD-10-CM

## 2019-03-23 DIAGNOSIS — M7918 Myalgia, other site: Secondary | ICD-10-CM | POA: Insufficient documentation

## 2019-03-23 DIAGNOSIS — M542 Cervicalgia: Secondary | ICD-10-CM | POA: Insufficient documentation
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
## 2019-03-24 MED ORDER — CYCLOBENZAPRINE HCL 10 MG PO TABS: 10.0000 mg | ORAL_TABLET | Freq: Two times a day (BID) | ORAL | 0 refills | Status: DC | PRN

## 2019-03-24 MED ORDER — IBUPROFEN 400 MG PO TABS
400.0000 mg | ORAL_TABLET | Freq: Once | ORAL | Status: AC
  Administered 2019-03-24: 400 mg via ORAL
  Filled 2019-03-24: qty 1

## 2019-03-24 MED FILL — CYCLOBENZAPRINE HCL 10 MG T: 10 | 10 days supply | Qty: 20 | Fill #0
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
## 2019-04-06 ENCOUNTER — Encounter (HOSPITAL_BASED_OUTPATIENT_CLINIC_OR_DEPARTMENT_OTHER): Payer: Self-pay | Admitting: *Deleted

## 2019-04-06 ENCOUNTER — Emergency Department (HOSPITAL_BASED_OUTPATIENT_CLINIC_OR_DEPARTMENT_OTHER)
Admission: EM | Admit: 2019-04-06 | Discharge: 2019-04-06 | Disposition: A | Discharge status: Home / Self Care | Payer: Self-pay | Attending: Emergency Medicine | Admitting: Emergency Medicine

## 2019-04-06 ENCOUNTER — Other Ambulatory Visit: Payer: Self-pay

## 2019-04-06 DIAGNOSIS — A599 Trichomoniasis, unspecified: Secondary | ICD-10-CM | POA: Insufficient documentation

## 2019-04-06 DIAGNOSIS — N739 Female pelvic inflammatory disease, unspecified: Secondary | ICD-10-CM | POA: Insufficient documentation

## 2019-04-06 DIAGNOSIS — Z793 Long term (current) use of hormonal contraceptives: Secondary | ICD-10-CM | POA: Insufficient documentation

## 2019-04-06 DIAGNOSIS — Z883 Allergy status to other anti-infective agents status: Secondary | ICD-10-CM | POA: Insufficient documentation

## 2019-04-06 DIAGNOSIS — M25512 Pain in left shoulder: Secondary | ICD-10-CM | POA: Insufficient documentation

## 2019-04-06 DIAGNOSIS — M25511 Pain in right shoulder: Secondary | ICD-10-CM | POA: Insufficient documentation

## 2019-04-06 DIAGNOSIS — Z87891 Personal history of nicotine dependence: Secondary | ICD-10-CM | POA: Insufficient documentation

## 2019-04-06 DIAGNOSIS — R102 Pelvic and perineal pain: Secondary | ICD-10-CM | POA: Insufficient documentation

## 2019-04-06 LAB — URINALYSIS, ROUTINE W REFLEX MICROSCOPIC
Glucose, UA: NEGATIVE mg/dL
Ketones, ur: 15 mg/dL — AB
Nitrite: NEGATIVE
Protein, ur: NEGATIVE mg/dL
Specific Gravity, Urine: 1.025 (ref 1.005–1.030)
pH: 6.5 (ref 5.0–8.0)

## 2019-04-06 LAB — PREGNANCY, URINE: Preg Test, Ur: NEGATIVE

## 2019-04-06 LAB — URINALYSIS, MICROSCOPIC (REFLEX)

## 2019-04-06 LAB — WET PREP, GENITAL
Sperm: NONE SEEN
Yeast Wet Prep HPF POC: NONE SEEN

## 2019-04-06 MED ORDER — LIDOCAINE HCL (PF) 1 % IJ SOLN
INTRAMUSCULAR | Status: AC
  Administered 2019-04-06: 2.1 mL
  Filled 2019-04-06: qty 5

## 2019-04-06 MED ORDER — ONDANSETRON 4 MG PO TBDP: 4.0000 mg | ORAL_TABLET | Freq: Three times a day (TID) | ORAL | 0 refills | Status: DC | PRN

## 2019-04-06 MED ORDER — CEFTRIAXONE SODIUM 1 G IJ SOLR
INTRAMUSCULAR | Status: AC
  Administered 2019-04-06: 16:00:00 500 mg via INTRAMUSCULAR
  Filled 2019-04-06: qty 10

## 2019-04-06 MED ORDER — NAPROXEN 500 MG PO TABS: 500.0000 mg | ORAL_TABLET | Freq: Three times a day (TID) | ORAL | 0 refills | Status: AC

## 2019-04-06 MED ORDER — METRONIDAZOLE 500 MG PO TABS
2000.0000 mg | ORAL_TABLET | Freq: Once | ORAL | Status: AC
  Administered 2019-04-06: 16:00:00 2000 mg via ORAL
  Filled 2019-04-06: qty 4

## 2019-04-06 MED ORDER — DOXYCYCLINE HYCLATE 100 MG PO CAPS: 100.0000 mg | ORAL_CAPSULE | Freq: Two times a day (BID) | ORAL | 0 refills | Status: DC

## 2019-04-06 MED ORDER — CEFTRIAXONE SODIUM 1 G IJ SOLR: 500.0000 mg | Freq: Once | INTRAMUSCULAR | Status: AC

## 2019-04-06 MED ORDER — ONDANSETRON 8 MG PO TBDP
8.0000 mg | ORAL_TABLET | Freq: Once | ORAL | Status: AC
  Administered 2019-04-06: 16:00:00 8 mg via ORAL
  Filled 2019-04-06: qty 1

## 2019-04-06 MED ORDER — AZITHROMYCIN 250 MG PO TABS
1000.0000 mg | ORAL_TABLET | Freq: Once | ORAL | Status: AC
  Administered 2019-04-06: 16:00:00 1000 mg via ORAL
  Filled 2019-04-06: qty 4

## 2019-04-06 MED ORDER — METHOCARBAMOL 500 MG PO TABS: 1000.0000 mg | ORAL_TABLET | Freq: Three times a day (TID) | ORAL | 0 refills | Status: AC

## 2019-04-06 MED FILL — DOXYCYCLINE HYCLATE 100 MG: 100 | 10 days supply | Qty: 20 | Fill #0
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
## 2019-04-06 NOTE — ED Triage Notes (Signed)
Lower back pain, pelvic pain, nausea. Vaginal discharge.
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
## 2019-04-06 NOTE — Discharge Instructions (Signed)
You were seen in the ER for pelvic pain and vaginal discharge  You have an STD called trichomonas and pelvic inflammatory disease  This is treated with antibiotics and antiinflammatories.   Take doxycyline as prescribed.    For pain and inflammation you can use a combination of [phone] mg acetaminophen (tylenol) every 6 hours.  You can add naproxen 500 mg every 8 hours for more pain control.  You can take these separately or combine them every 6-8 hours for maximum pain control. Do not exceed 4,000 mg acetaminophen.  Do not take acetaminophen if you have liver disease.   Do not have sexual encounter for the next 7-10 days after treatment. Notify any partners of your test results as they need testing and treatment  Return to ER for fever greater than 100.4 F, worsening pelvic pain or bleeding or discharge

## 2019-04-06 NOTE — ED Notes (Signed)
Assisted with pelvic  °

## 2019-04-06 NOTE — ED Notes (Signed)
Pt. Repots her shoulders hurt hurt and scapulae hurt bilat.

## 2019-04-07 LAB — GC/CHLAMYDIA PROBE AMP (~~LOC~~) NOT AT ARMC
Chlamydia: NEGATIVE
Neisseria Gonorrhea: POSITIVE — AB

## 2019-04-29 MED FILL — NAPROXEN 500 MG TABLET: 500 | 7 days supply | Qty: 21 | Fill #0

## 2019-04-29 MED FILL — METHOCARBAMOL 500 MG TABS: 500 | 7 days supply | Qty: 42 | Fill #0

## 2019-10-22 ENCOUNTER — Emergency Department (HOSPITAL_BASED_OUTPATIENT_CLINIC_OR_DEPARTMENT_OTHER)
Admission: EM | Admit: 2019-10-22 | Discharge: 2019-10-22 | Disposition: A | Discharge status: Home / Self Care | Payer: Medicaid Other | Attending: Emergency Medicine | Admitting: Emergency Medicine

## 2019-10-22 ENCOUNTER — Other Ambulatory Visit: Payer: Self-pay

## 2019-10-22 ENCOUNTER — Encounter (HOSPITAL_BASED_OUTPATIENT_CLINIC_OR_DEPARTMENT_OTHER): Payer: Self-pay | Admitting: Emergency Medicine

## 2019-10-22 ENCOUNTER — Emergency Department (HOSPITAL_BASED_OUTPATIENT_CLINIC_OR_DEPARTMENT_OTHER): Admit: 2019-10-22 | Payer: Medicaid Other

## 2019-10-22 DIAGNOSIS — A599 Trichomoniasis, unspecified: Secondary | ICD-10-CM

## 2019-10-22 DIAGNOSIS — Z87891 Personal history of nicotine dependence: Secondary | ICD-10-CM | POA: Insufficient documentation

## 2019-10-22 DIAGNOSIS — N739 Female pelvic inflammatory disease, unspecified: Secondary | ICD-10-CM

## 2019-10-22 DIAGNOSIS — Z91040 Latex allergy status: Secondary | ICD-10-CM | POA: Insufficient documentation

## 2019-10-22 HISTORY — DX: Trichomoniasis, unspecified: A59.9

## 2019-10-22 HISTORY — DX: Gonococcal infection, unspecified: A54.9

## 2019-10-22 LAB — WET PREP, GENITAL
Clue Cells Wet Prep HPF POC: NONE SEEN
Sperm: NONE SEEN
Yeast Wet Prep HPF POC: NONE SEEN

## 2019-10-22 LAB — PREGNANCY, URINE: Preg Test, Ur: NEGATIVE

## 2019-10-22 LAB — URINALYSIS, ROUTINE W REFLEX MICROSCOPIC
Bilirubin Urine: NEGATIVE
Glucose, UA: NEGATIVE mg/dL
Ketones, ur: NEGATIVE mg/dL
Nitrite: NEGATIVE
Protein, ur: NEGATIVE mg/dL
Specific Gravity, Urine: >1.03 — ABNORMAL HIGH (ref 1.005–1.030)
pH: 6 (ref 5.0–8.0)

## 2019-10-22 LAB — URINALYSIS, MICROSCOPIC (REFLEX)

## 2019-10-22 LAB — HIV ANTIBODY (ROUTINE TESTING W REFLEX): HIV Screen 4th Generation wRfx: NONREACTIVE

## 2019-10-22 MED ORDER — DOXYCYCLINE HYCLATE 100 MG PO CAPS: 100.0000 mg | ORAL_CAPSULE | Freq: Two times a day (BID) | ORAL | 0 refills | Status: DC

## 2019-10-22 MED ORDER — LIDOCAINE HCL (PF) 1 % IJ SOLN
INTRAMUSCULAR | Status: AC
  Administered 2019-10-22: 1 mL
  Filled 2019-10-22: qty 5

## 2019-10-22 MED ORDER — METRONIDAZOLE 500 MG PO TABS
2000.0000 mg | ORAL_TABLET | Freq: Once | ORAL | Status: AC
  Administered 2019-10-22: 2000 mg via ORAL
  Filled 2019-10-22: qty 4

## 2019-10-22 MED ORDER — CEFTRIAXONE SODIUM 500 MG IJ SOLR: 250.0000 mg | Freq: Once | INTRAMUSCULAR | Status: AC

## 2019-10-22 MED ORDER — CEFTRIAXONE SODIUM 500 MG IJ SOLR
INTRAMUSCULAR | Status: AC
  Administered 2019-10-22: 250 mg via INTRAMUSCULAR
  Filled 2019-10-22: qty 500

## 2019-10-22 MED FILL — DOXYCYCLINE HYCLATE 100 MG: 100 | 14 days supply | Qty: 28 | Fill #0

## 2019-10-22 NOTE — ED Triage Notes (Signed)
Pt reports bilateral pelvic pain for one week;; pt reports hx of STI; pt requesting check for recurrence; pt reports greyish discharge

## 2019-10-22 NOTE — ED Provider Notes (Addendum)
[facility] Provider Note: Lowella Dell, MD, FACEP  CSN: [id number] MRN: [id number] ARRIVAL: 10/22/19 at 0225 ROOM: [facility]   CHIEF COMPLAINT  Pelvic Pain   HISTORY OF PRESENT ILLNESS  10/22/19 2:47 AM Alexandra Nguyen is a 29 y.o. female with right-sided pelvic pain for a week.  She rates her pain as an 8 out of 10, sharp, and worse with movement or or ambulation.  She has had an associated grayish discharge from her vagina.  She has not noted an abnormal odor.  She has not had abnormal vaginal bleeding.   Past Medical History:  Diagnosis Date  . Allergy   . Anemia   . Gonorrhea   . Trichomoniasis     Past Surgical History:  Procedure Laterality Date  . NO PAST SURGERIES      Family History  Problem Relation Age of Onset  . Diabetes Other   . Alcohol abuse Neg Hx   . Asthma Neg Hx   . Arthritis Neg Hx   . Birth defects Neg Hx   . Cancer Neg Hx   . COPD Neg Hx   . Depression Neg Hx   . Drug abuse Neg Hx   . Early death Neg Hx   . Hearing loss Neg Hx   . Heart disease Neg Hx   . Hyperlipidemia Neg Hx   . Hypertension Neg Hx   . Kidney disease Neg Hx   . Learning disabilities Neg Hx   . Mental illness Neg Hx   . Mental retardation Neg Hx   . Miscarriages / Stillbirths Neg Hx   . Stroke Neg Hx   . Varicose Veins Neg Hx   . Vision loss Neg Hx     Social History   Tobacco Use  . Smoking status: Former Smoker    Packs/day: 0.50    Years: 4.00    Pack years: 2.00    Types: E-cigarettes, Cigarettes    Quit date: 03/26/2018    Years since quitting: 1.5  . Smokeless tobacco: Never Used  Vaping Use  . Vaping Use: Former  Substance Use Topics  . Alcohol use: Yes    Comment: social  . Drug use: Not Currently    Comment: 15 years    Prior to Admission medications   Medication Sig Start Date End Date Taking? Authorizing Provider  doxycycline (VIBRAMYCIN) 100 MG capsule Take 1 capsule (100 mg total) by mouth 2 (two) times daily. 10/22/19    Lateisha Thurlow, MD  norgestimate-ethinyl estradiol (SPRINTEC 28) 0.25-35 MG-MCG tablet Take 1 tablet by mouth daily. 09/16/16   Mesner, Barbara Cower, MD    Allergies A & d [dimethicone-zinc oxide-vit a-d], Fish allergy, and Latex   REVIEW OF SYSTEMS  Negative except as noted here or in the History of Present Illness.   PHYSICAL EXAMINATION  Initial Vital Signs Blood pressure 126/84, pulse 94, temperature 98.2 F (36.8 C), temperature source Oral, resp. rate 14, height 5\' 9"  (1.753 m), weight (!) 95.3 kg, SpO2 100 %, unknown if currently breastfeeding.  Examination General: Well-developed, well-nourished female in no acute distress; appearance consistent with age of record HENT: normocephalic; atraumatic Eyes: pupils equal, round and reactive to light; extraocular muscles intact Neck: supple Heart: regular rate and rhythm Lungs: clear to auscultation bilaterally Abdomen: soft; nondistended; right suprapubic tenderness; bowel sounds present GU: White vulvovaginal discharge; strawberry cervix; cervical motion tenderness; right adnexal tenderness Extremities: No deformity; full range of motion; pulses normal Neurologic: Awake, alert and oriented; motor function  intact in all extremities and symmetric; no facial droop Skin: Warm and dry Psychiatric: Normal mood and affect   RESULTS  Summary of this visit's results, reviewed and interpreted by myself:   EKG Interpretation  Date/Time:    Ventricular Rate:    PR Interval:    QRS Duration:   QT Interval:    QTC Calculation:   R Axis:     Text Interpretation:        Laboratory Studies: Results for orders placed or performed during the hospital encounter of 10/22/19 (from the past 24 hour(s))  Urinalysis, Routine w reflex microscopic     Status: Abnormal   Collection Time: 10/22/19  3:05 AM  Result Value Ref Range   Color, Urine YELLOW YELLOW   APPearance CLOUDY (A) CLEAR   Specific Gravity, Urine >1.030 (H) 1.005 - 1.030   pH 6.0  5.0 - 8.0   Glucose, UA NEGATIVE NEGATIVE mg/dL   Hgb urine dipstick MODERATE (A) NEGATIVE   Bilirubin Urine NEGATIVE NEGATIVE   Ketones, ur NEGATIVE NEGATIVE mg/dL   Protein, ur NEGATIVE NEGATIVE mg/dL   Nitrite NEGATIVE NEGATIVE   Leukocytes,Ua MODERATE (A) NEGATIVE  Pregnancy, urine     Status: None   Collection Time: 10/22/19  3:05 AM  Result Value Ref Range   Preg Test, Ur NEGATIVE NEGATIVE  Wet prep, genital     Status: Abnormal   Collection Time: 10/22/19  3:05 AM  Result Value Ref Range   Yeast Wet Prep HPF POC NONE SEEN NONE SEEN   Trich, Wet Prep PRESENT (A) NONE SEEN   Clue Cells Wet Prep HPF POC NONE SEEN NONE SEEN   WBC, Wet Prep HPF POC MANY (A) NONE SEEN   Sperm NONE SEEN   Urinalysis, Microscopic (reflex)     Status: Abnormal   Collection Time: 10/22/19  3:05 AM  Result Value Ref Range   RBC / HPF 6-10 0 - 5 RBC/hpf   WBC, UA 11-20 0 - 5 WBC/hpf   Bacteria, UA MANY (A) NONE SEEN   Squamous Epithelial / LPF 11-20 0 - 5   Mucus PRESENT    Trichomonas, UA PRESENT (A) NONE SEEN   Ca Oxalate Crys, UA PRESENT    Imaging Studies: No results found.  ED COURSE and MDM  Nursing notes, initial and subsequent vitals signs, including pulse oximetry, reviewed and interpreted by myself.  Vitals:   10/22/19 0234 10/22/19 0235  BP:  126/84  Pulse:  94  Resp:  14  Temp:  98.2 F (36.8 C)  TempSrc:  Oral  SpO2:  100%  Weight: (!) 95.3 kg   Height: 5\' 9"  (1.753 m)    Medications  metroNIDAZOLE (FLAGYL) tablet 2,000 mg (has no administration in time range)  cefTRIAXone (ROCEPHIN) injection 250 mg (250 mg Intramuscular Given 10/22/19 0341)  lidocaine (PF) (XYLOCAINE) 1 % injection (1 mL  Given 10/22/19 0342)    We will treat for trichomoniasis as well as PID.  The patient's degree of pain is greater than that expected with trichomoniasis.  Although there are white blood cells and bacteria in her urine these may be due to vulvovaginal contamination.  Urine was sent for  culture.  Will have patient return for pelvic ultrasound later this morning.   PROCEDURES  Procedures   ED DIAGNOSES     ICD-10-CM   1. Trichomoniasis  A59.9   2. PID (pelvic inflammatory disease)  N73.9        Bathsheba Durrett, MD 10/22/19 [phone]  Alexsander Cavins, Jonny Ruiz, MD 10/22/19 [phone]

## 2019-10-23 LAB — GC/CHLAMYDIA PROBE AMP (~~LOC~~) NOT AT ARMC
Chlamydia: NEGATIVE
Comment: NEGATIVE
Comment: NORMAL
Neisseria Gonorrhea: POSITIVE — AB

## 2019-10-23 LAB — URINE CULTURE

## 2019-10-23 LAB — RPR: RPR Ser Ql: NONREACTIVE

## 2020-03-17 ENCOUNTER — Emergency Department (HOSPITAL_BASED_OUTPATIENT_CLINIC_OR_DEPARTMENT_OTHER)
Admission: EM | Admit: 2020-03-17 | Discharge: 2020-03-17 | Disposition: A | Discharge status: Home / Self Care | Payer: Self-pay | Attending: Emergency Medicine | Admitting: Emergency Medicine

## 2020-03-17 ENCOUNTER — Encounter (HOSPITAL_BASED_OUTPATIENT_CLINIC_OR_DEPARTMENT_OTHER): Payer: Self-pay | Admitting: Emergency Medicine

## 2020-03-17 ENCOUNTER — Emergency Department (HOSPITAL_BASED_OUTPATIENT_CLINIC_OR_DEPARTMENT_OTHER): Payer: Self-pay

## 2020-03-17 ENCOUNTER — Other Ambulatory Visit: Payer: Self-pay

## 2020-03-17 DIAGNOSIS — R102 Pelvic and perineal pain: Secondary | ICD-10-CM

## 2020-03-17 DIAGNOSIS — Z3A01 Less than 8 weeks gestation of pregnancy: Secondary | ICD-10-CM | POA: Insufficient documentation

## 2020-03-17 DIAGNOSIS — O23591 Infection of other part of genital tract in pregnancy, first trimester: Secondary | ICD-10-CM | POA: Insufficient documentation

## 2020-03-17 DIAGNOSIS — A599 Trichomoniasis, unspecified: Secondary | ICD-10-CM | POA: Insufficient documentation

## 2020-03-17 DIAGNOSIS — Z91040 Latex allergy status: Secondary | ICD-10-CM | POA: Insufficient documentation

## 2020-03-17 DIAGNOSIS — O0001 Abdominal pregnancy with intrauterine pregnancy: Secondary | ICD-10-CM | POA: Insufficient documentation

## 2020-03-17 DIAGNOSIS — Z87891 Personal history of nicotine dependence: Secondary | ICD-10-CM | POA: Insufficient documentation

## 2020-03-17 DIAGNOSIS — Z3491 Encounter for supervision of normal pregnancy, unspecified, first trimester: Secondary | ICD-10-CM

## 2020-03-17 LAB — HCG, QUANTITATIVE, PREGNANCY: hCG, Beta Chain, Quant, S: 50977 m[IU]/mL — ABNORMAL HIGH (ref <5)

## 2020-03-17 LAB — URINALYSIS, MICROSCOPIC (REFLEX)

## 2020-03-17 LAB — URINALYSIS, ROUTINE W REFLEX MICROSCOPIC
Bilirubin Urine: NEGATIVE
Glucose, UA: NEGATIVE mg/dL
Ketones, ur: NEGATIVE mg/dL
Nitrite: NEGATIVE
Protein, ur: NEGATIVE mg/dL
Specific Gravity, Urine: 1.02 (ref 1.005–1.030)
pH: 7 (ref 5.0–8.0)

## 2020-03-17 LAB — COMPREHENSIVE METABOLIC PANEL
ALT: 12 U/L (ref 0–44)
AST: 13 U/L — ABNORMAL LOW (ref 15–41)
Albumin: 4.1 g/dL (ref 3.5–5.0)
Alkaline Phosphatase: 53 U/L (ref 38–126)
Anion gap: 9 (ref 5–15)
BUN: 8 mg/dL (ref 6–20)
CO2: 23 mmol/L (ref 22–32)
Calcium: 9 mg/dL (ref 8.9–10.3)
Chloride: 102 mmol/L (ref 98–111)
Creatinine, Ser: 0.61 mg/dL (ref 0.44–1.00)
GFR, Estimated: >60 mL/min (ref >60)
Glucose, Bld: 91 mg/dL (ref 70–99)
Potassium: 3.3 mmol/L — ABNORMAL LOW (ref 3.5–5.1)
Sodium: 134 mmol/L — ABNORMAL LOW (ref 135–145)
Total Bilirubin: 1.4 mg/dL — ABNORMAL HIGH (ref 0.3–1.2)
Total Protein: 7.6 g/dL (ref 6.5–8.1)

## 2020-03-17 LAB — CBC WITH DIFFERENTIAL/PLATELET
Abs Immature Granulocytes: 0.01 10*3/uL (ref 0.00–0.07)
Basophils Absolute: 0 10*3/uL (ref 0.0–0.1)
Basophils Relative: 1 %
Eosinophils Absolute: 0.1 10*3/uL (ref 0.0–0.5)
Eosinophils Relative: 1 %
HCT: 38.4 % (ref 36.0–46.0)
Hemoglobin: 12.5 g/dL (ref 12.0–15.0)
Immature Granulocytes: 0 %
Lymphocytes Relative: 29 %
Lymphs Abs: 2.1 10*3/uL (ref 0.7–4.0)
MCH: 28.2 pg (ref 26.0–34.0)
MCHC: 32.6 g/dL (ref 30.0–36.0)
MCV: 86.5 fL (ref 80.0–100.0)
Monocytes Absolute: 0.5 10*3/uL (ref 0.1–1.0)
Monocytes Relative: 7 %
Neutro Abs: 4.5 10*3/uL (ref 1.7–7.7)
Neutrophils Relative %: 62 %
Platelets: 239 10*3/uL (ref 150–400)
RBC: 4.44 MIL/uL (ref 3.87–5.11)
RDW: 12.5 % (ref 11.5–15.5)
WBC: 7.1 10*3/uL (ref 4.0–10.5)
nRBC: 0 % (ref 0.0–0.2)

## 2020-03-17 LAB — WET PREP, GENITAL
Clue Cells Wet Prep HPF POC: NONE SEEN
Sperm: NONE SEEN
Yeast Wet Prep HPF POC: NONE SEEN

## 2020-03-17 LAB — PREGNANCY, URINE: Preg Test, Ur: POSITIVE — AB

## 2020-03-17 MED ORDER — PROMETHAZINE HCL 25 MG/ML IJ SOLN
25.0000 mg | Freq: Once | INTRAMUSCULAR | Status: AC
  Administered 2020-03-17: 25 mg via INTRAMUSCULAR
  Filled 2020-03-17: qty 1

## 2020-03-17 MED ORDER — METRONIDAZOLE 500 MG PO TABS
2000.0000 mg | ORAL_TABLET | Freq: Once | ORAL | Status: AC
  Administered 2020-03-17: 18:00:00 2000 mg via ORAL
  Filled 2020-03-17: qty 4

## 2020-03-17 NOTE — ED Provider Notes (Signed)
[facility] EMERGENCY DEPARTMENT Provider Note   CSN: [id number] Arrival date & time: 03/17/20  1115     History Chief Complaint  Patient presents with  . Vaginal Discharge         Alexandra Nguyen is a 29 y.o. female G2P1, w PMHx trichomonas, gonorrhea, presenting to the ED with complaint of vaginal discharge and lower abdominal pain. Symptoms started about 3 days ago.  She has a abnormal grayish vaginal discharge.  She has lower abdominal cramping that feels like menstrual cramping.  LMP was 02/01/2020.  States she is sexually active with her boyfriend.  No fevers, urinary symptoms, diarrhea or constipation.  The history is provided by the patient.       Past Medical History:  Diagnosis Date  . Allergy   . Anemia   . Gonorrhea   . Trichomoniasis     Patient Active Problem List   Diagnosis Date Noted  . Spontaneous vaginal delivery 03/01/2015  . Active labor 02/28/2015  . Near syncope 08/07/2014  . Dehydration, mild 08/07/2014  . Nausea and vomiting during pregnancy prior to [redacted] weeks gestation 08/07/2014  . Gonorrhea affecting pregnancy in first trimester 08/07/2014    Past Surgical History:  Procedure Laterality Date  . NO PAST SURGERIES       OB History    Gravida  2   Para  1   Term  1   Preterm  0   AB  1   Living  1     SAB  0   IAB  1   Ectopic  0   Multiple  0   Live Births  1           Family History  Problem Relation Age of Onset  . Diabetes Other   . Alcohol abuse Neg Hx   . Asthma Neg Hx   . Arthritis Neg Hx   . Birth defects Neg Hx   . Cancer Neg Hx   . COPD Neg Hx   . Depression Neg Hx   . Drug abuse Neg Hx   . Early death Neg Hx   . Hearing loss Neg Hx   . Heart disease Neg Hx   . Hyperlipidemia Neg Hx   . Hypertension Neg Hx   . Kidney disease Neg Hx   . Learning disabilities Neg Hx   . Mental illness Neg Hx   . Mental retardation Neg Hx   . Miscarriages / Stillbirths Neg Hx   . Stroke Neg Hx    . Varicose Veins Neg Hx   . Vision loss Neg Hx     Social History   Tobacco Use  . Smoking status: Former Smoker    Packs/day: 0.50    Years: 4.00    Pack years: 2.00    Types: E-cigarettes, Cigarettes    Quit date: 03/26/2018    Years since quitting: 1.9  . Smokeless tobacco: Never Used  Vaping Use  . Vaping Use: Former  Substance Use Topics  . Alcohol use: Not Currently    Comment: social  . Drug use: Not Currently    Comment: 15 years    Home Medications Prior to Admission medications   Medication Sig Start Date End Date Taking? Authorizing Provider  doxycycline (VIBRAMYCIN) 100 MG capsule Take 1 capsule (100 mg total) by mouth 2 (two) times daily. 10/22/19   Molpus, John, MD  norgestimate-ethinyl estradiol (SPRINTEC 28) 0.25-35 MG-MCG tablet Take 1 tablet by mouth daily. 09/16/16  Mesner, Barbara Cower, MD    Allergies    A & d [dimethicone-zinc oxide-vit a-d], Fish allergy, and Latex  Review of Systems   Review of Systems  Constitutional: Negative for fever.  Gastrointestinal: Positive for abdominal pain.  Genitourinary: Positive for vaginal discharge. Negative for vaginal bleeding.  All other systems reviewed and are negative.   Physical Exam Updated Vital Signs BP 111/69 (BP Location: Right Arm)   Pulse 67   Temp 98.4 F (36.9 C) (Oral)   Resp 16   Ht 5\' 8"  (1.727 m)   Wt 98 kg   LMP 02/01/2020   SpO2 100%   BMI 32.84 kg/m   Physical Exam Vitals and nursing note reviewed.  Constitutional:      Appearance: She is well-developed and well-nourished.  HENT:     Head: Normocephalic and atraumatic.  Eyes:     Conjunctiva/sclera: Conjunctivae normal.  Cardiovascular:     Rate and Rhythm: Normal rate and regular rhythm.  Pulmonary:     Effort: Pulmonary effort is normal. No respiratory distress.     Breath sounds: Normal breath sounds.  Abdominal:     General: Bowel sounds are normal.     Palpations: Abdomen is soft.     Tenderness: There is abdominal  tenderness in the right lower quadrant, suprapubic area and left lower quadrant. There is no guarding or rebound.  Genitourinary:    General: Normal vulva.     Comments: Exam performed with female RN chaperone present.  There is copious amount of grayish-white discharge present.  Cervix is friable.  Some generalized tenderness on bimanual exam though no chandelier sign. Skin:    General: Skin is warm.  Neurological:     Mental Status: She is alert.  Psychiatric:        Mood and Affect: Mood and affect normal.        Behavior: Behavior normal.     ED Results / Procedures / Treatments   Labs (all labs ordered are listed, but only abnormal results are displayed) Labs Reviewed  WET PREP, GENITAL - Abnormal; Notable for the following components:      Result Value   Trich, Wet Prep PRESENT (*)    WBC, Wet Prep HPF POC MANY (*)    All other components within normal limits  URINALYSIS, ROUTINE W REFLEX MICROSCOPIC - Abnormal; Notable for the following components:   APPearance CLOUDY (*)    Hgb urine dipstick SMALL (*)    Leukocytes,Ua SMALL (*)    All other components within normal limits  PREGNANCY, URINE - Abnormal; Notable for the following components:   Preg Test, Ur POSITIVE (*)    All other components within normal limits  URINALYSIS, MICROSCOPIC (REFLEX) - Abnormal; Notable for the following components:   Bacteria, UA MANY (*)    All other components within normal limits  HCG, QUANTITATIVE, PREGNANCY - Abnormal; Notable for the following components:   hCG, Beta Chain, Quant, S 50,977 (*)    All other components within normal limits  COMPREHENSIVE METABOLIC PANEL - Abnormal; Notable for the following components:   Sodium 134 (*)    Potassium 3.3 (*)    AST 13 (*)    Total Bilirubin 1.4 (*)    All other components within normal limits  CBC WITH DIFFERENTIAL/PLATELET  HIV ANTIBODY (ROUTINE TESTING W REFLEX)  RPR  GC/CHLAMYDIA PROBE AMP ([place]) NOT AT [facility]     EKG None  Radiology [facility] OB LESS THAN 14 WEEKS WITH OB TRANSVAGINAL  Result Date: 03/17/2020 CLINICAL DATA:  Pelvic pain EXAM: OBSTETRIC <14 WK [place] AND TRANSVAGINAL OB US TECHNIQUE: Both transabdominal and transvaginal ultrasound examinations were performed for complete evaluation of the gestation as well as the maternal uterus, adnexal regions, and pelvic cul-de-sac. Transvaginal technique was performed to assess early pregnancy. COMPARISON:  None. FINDINGS: Intrauterine gestational sac: Single Yolk sac:  Visualized. Embryo:  Visualized. Cardiac Activity: Visualized. Heart Rate: 113 bpm CRL: 3.9 mm   6 w   0 d                  [place] EDC: 11/10/2020 Subchorionic hemorrhage:  None visualized. Maternal uterus/adnexae: Right ovary measures 5.2 x 4.8 by 3.9 cm, with a 3.4 cm cyst likely representing a corpus luteum cyst. Left ovary is not well seen. There is trace free fluid in the pelvis. IMPRESSION: 1. Single live intrauterine pregnancy as above, estimated age 6 weeks and 0 days. 2. Corpus luteum cyst within the right adnexa. Electronically Signed   By: Sharlet Salina M.D.   On: 03/17/2020 17:10    Procedures Procedures (including critical care time)  Medications Ordered in ED Medications  promethazine (PHENERGAN) injection 25 mg (25 mg Intramuscular Given 03/17/20 1735)  metroNIDAZOLE (FLAGYL) tablet 2,000 mg (2,000 mg Oral Given 03/17/20 1828)    ED Course  I have reviewed the triage vital signs and the nursing notes.  Pertinent labs & imaging results that were available during my care of the patient were reviewed by me and considered in my medical decision making (see chart for details).    MDM Rules/Calculators/A&P                          Patient presenting for vaginal discharge and lower abdominal pain that began a few days ago.  On exam she is well-appearing and in no distress.  Pregnancy test obtained in triage is positive, followed with quantitative hCG of 50,977.  She has some  generalized lower abdominal tenderness on exam without guarding or rebound. Pelvic exam with d/c present, no significant TTP, exam not concerning for PID at this time. Pelvic ultrasound shows single live intrauterine pregnancy estimated [redacted] weeks gestational age.  Wet prep with trichomonas, STD culture sent.  She is treated with 1 dose of 2 g Flagyl for trichomonas, as patient did not tolerate the week-long course of antibiotic well.  Her nausea is much improved with Phenergan here.  She is instructed to begin taking prenatal vitamin, also instructed of other symptomatic management for nausea in pregnancy.  She is provided with referral for obstetrics and discuss strict return precautions.  She is aware she has STD cultures pending and her partner will need to be treated for trichomonas.  She states this is the third time this year she has been positive for trichomonas from her partner.  Stressed importance of avoiding sexually transmitted infections when possible especially in pregnancy.  Patient is discharged in no acute distress.  Discussed results, findings, treatment and follow up. Patient advised of return precautions. Patient verbalized understanding and agreed with plan.  Final Clinical Impression(s) / ED Diagnoses Final diagnoses:  Trichomonas vaginalis infection  Normal intrauterine pregnancy on prenatal ultrasound in first trimester    Rx / DC Orders ED Discharge Orders    None       Rylie Knierim, [place] N, PA-C 03/17/20 1923    Milagros Loll, MD 03/21/20 [phone]

## 2020-03-17 NOTE — Discharge Instructions (Signed)
Please begin taking a prenatal vitamin daily. You can take 1/2 or 1 tab of Unisom (doxylamine) at bedtime with vitamin B6 to help with nausea. Eat small frequent meals throughout the day to help with nausea. You have been treated today for trichomonas.  Do not drink alcohol with the next 48 hours of taking this medication.  It is also recommended you not drink any alcohol while pregnant.  This is a sexually transmitted infection.  Your sexual partners will need to be treated.  Avoid any intercourse with your partner until your partners have completed treatment and retest as negative. Establish care with the obstetrician for regular prenatal care.  You have been given a referral to use as needed. Report to the [facility] maternity assessment unit for any new or concerning symptoms regarding your pregnancy, including worsening abdominal pain, vaginal bleeding.  You have STD cultures still processing, including gonorrhea, chlamydia, HIV, and syphilis.  Avoid any intercourse until you know all of your results.  Inform all sexual partners of any positive results.

## 2020-03-17 NOTE — ED Notes (Signed)
Patient transported to Ultrasound 

## 2020-03-17 NOTE — ED Triage Notes (Signed)
States has having lower abd pain x 3 days, radiates to lower left back. Vag d/c grayish, foaming. No dysuria

## 2020-03-18 LAB — HIV ANTIBODY (ROUTINE TESTING W REFLEX): HIV Screen 4th Generation wRfx: NONREACTIVE

## 2020-03-18 LAB — RPR: RPR Ser Ql: NONREACTIVE

## 2020-03-21 LAB — GC/CHLAMYDIA PROBE AMP (~~LOC~~) NOT AT ARMC
Chlamydia: POSITIVE — AB
Comment: NEGATIVE
Comment: NORMAL
Neisseria Gonorrhea: NEGATIVE

## 2020-03-27 ENCOUNTER — Encounter (HOSPITAL_BASED_OUTPATIENT_CLINIC_OR_DEPARTMENT_OTHER): Payer: Self-pay | Admitting: Emergency Medicine

## 2020-03-27 ENCOUNTER — Other Ambulatory Visit: Payer: Self-pay

## 2020-03-27 DIAGNOSIS — O200 Threatened abortion: Secondary | ICD-10-CM | POA: Insufficient documentation

## 2020-03-27 DIAGNOSIS — Z3A01 Less than 8 weeks gestation of pregnancy: Secondary | ICD-10-CM | POA: Insufficient documentation

## 2020-03-27 DIAGNOSIS — Z91040 Latex allergy status: Secondary | ICD-10-CM | POA: Insufficient documentation

## 2020-03-27 DIAGNOSIS — O99891 Other specified diseases and conditions complicating pregnancy: Secondary | ICD-10-CM | POA: Insufficient documentation

## 2020-03-27 DIAGNOSIS — E860 Dehydration: Secondary | ICD-10-CM | POA: Insufficient documentation

## 2020-03-27 DIAGNOSIS — Z87891 Personal history of nicotine dependence: Secondary | ICD-10-CM | POA: Insufficient documentation

## 2020-03-27 DIAGNOSIS — R079 Chest pain, unspecified: Secondary | ICD-10-CM | POA: Insufficient documentation

## 2020-03-27 DIAGNOSIS — O99281 Endocrine, nutritional and metabolic diseases complicating pregnancy, first trimester: Secondary | ICD-10-CM | POA: Insufficient documentation

## 2020-03-27 LAB — CBC
HCT: 42.7 % (ref 36.0–46.0)
Hemoglobin: 14.3 g/dL (ref 12.0–15.0)
MCH: 27.8 pg (ref 26.0–34.0)
MCHC: 33.5 g/dL (ref 30.0–36.0)
MCV: 83.1 fL (ref 80.0–100.0)
Platelets: 278 10*3/uL (ref 150–400)
RBC: 5.14 MIL/uL — ABNORMAL HIGH (ref 3.87–5.11)
RDW: 12 % (ref 11.5–15.5)
WBC: 9.3 10*3/uL (ref 4.0–10.5)
nRBC: 0 % (ref 0.0–0.2)

## 2020-03-27 LAB — COMPREHENSIVE METABOLIC PANEL
ALT: 10 U/L (ref 0–44)
AST: 12 U/L — ABNORMAL LOW (ref 15–41)
Albumin: 4.3 g/dL (ref 3.5–5.0)
Alkaline Phosphatase: 54 U/L (ref 38–126)
Anion gap: 13 (ref 5–15)
BUN: 11 mg/dL (ref 6–20)
CO2: 19 mmol/L — ABNORMAL LOW (ref 22–32)
Calcium: 9.6 mg/dL (ref 8.9–10.3)
Chloride: 101 mmol/L (ref 98–111)
Creatinine, Ser: 0.61 mg/dL (ref 0.44–1.00)
GFR, Estimated: >60 mL/min (ref >60)
Glucose, Bld: 99 mg/dL (ref 70–99)
Potassium: 3.6 mmol/L (ref 3.5–5.1)
Sodium: 133 mmol/L — ABNORMAL LOW (ref 135–145)
Total Bilirubin: 1.6 mg/dL — ABNORMAL HIGH (ref 0.3–1.2)
Total Protein: 8 g/dL (ref 6.5–8.1)

## 2020-03-27 LAB — LIPASE, BLOOD: Lipase: 23 U/L (ref 11–51)

## 2020-03-27 MED ORDER — ONDANSETRON HCL 4 MG/2ML IJ SOLN
4.0000 mg | Freq: Once | INTRAMUSCULAR | Status: AC | PRN
  Administered 2020-03-27: 4 mg via INTRAVENOUS
  Filled 2020-03-27: qty 2

## 2020-03-27 NOTE — ED Notes (Signed)
Patient aware of need for urine.  Cup given.  To let us know when able to collect.

## 2020-03-27 NOTE — ED Triage Notes (Signed)
Seen on 12/23 diagnosed with pregnancy.  Reports vaginal bleeding for the last 3 days and n/v.  Reports unable to keep anything down for a week.

## 2020-03-28 ENCOUNTER — Other Ambulatory Visit (HOSPITAL_BASED_OUTPATIENT_CLINIC_OR_DEPARTMENT_OTHER): Payer: Self-pay | Admitting: Emergency Medicine

## 2020-03-28 ENCOUNTER — Emergency Department (HOSPITAL_BASED_OUTPATIENT_CLINIC_OR_DEPARTMENT_OTHER)
Admission: EM | Admit: 2020-03-28 | Discharge: 2020-03-28 | Disposition: A | Discharge status: Home / Self Care | Payer: Medicaid Other | Attending: Emergency Medicine | Admitting: Emergency Medicine

## 2020-03-28 DIAGNOSIS — E860 Dehydration: Secondary | ICD-10-CM

## 2020-03-28 DIAGNOSIS — O200 Threatened abortion: Secondary | ICD-10-CM

## 2020-03-28 LAB — URINALYSIS, ROUTINE W REFLEX MICROSCOPIC
Glucose, UA: NEGATIVE mg/dL
Ketones, ur: >=80 mg/dL — AB
Leukocytes,Ua: NEGATIVE
Nitrite: NEGATIVE
Protein, ur: 30 mg/dL — AB
Specific Gravity, Urine: 1.025 (ref 1.005–1.030)
pH: 6.5 (ref 5.0–8.0)

## 2020-03-28 LAB — URINALYSIS, MICROSCOPIC (REFLEX)

## 2020-03-28 LAB — HCG, QUANTITATIVE, PREGNANCY: hCG, Beta Chain, Quant, S: 176143 m[IU]/mL — ABNORMAL HIGH (ref <5)

## 2020-03-28 MED ORDER — METOCLOPRAMIDE HCL 5 MG/ML IJ SOLN
5.0000 mg | Freq: Once | INTRAMUSCULAR | Status: AC
  Administered 2020-03-28: 5 mg via INTRAVENOUS
  Filled 2020-03-28: qty 2

## 2020-03-28 MED ORDER — PROMETHAZINE HCL 25 MG/ML IJ SOLN
12.5000 mg | Freq: Once | INTRAMUSCULAR | Status: AC
  Administered 2020-03-28: 12.5 mg via INTRAVENOUS
  Filled 2020-03-28: qty 1

## 2020-03-28 MED ORDER — ACETAMINOPHEN 325 MG PO TABS
650.0000 mg | ORAL_TABLET | Freq: Once | ORAL | Status: DC
  Filled 2020-03-28: qty 2

## 2020-03-28 MED ORDER — PROMETHAZINE HCL 25 MG PO TABS: 25.0000 mg | ORAL_TABLET | Freq: Four times a day (QID) | ORAL | 0 refills | Status: DC | PRN

## 2020-03-28 MED ORDER — ONDANSETRON HCL 4 MG/2ML IJ SOLN
4.0000 mg | Freq: Once | INTRAMUSCULAR | Status: AC
  Administered 2020-03-28: 4 mg via INTRAVENOUS
  Filled 2020-03-28: qty 2

## 2020-03-28 MED ORDER — LACTATED RINGERS IV BOLUS
1000.0000 mL | Freq: Once | INTRAVENOUS | Status: AC
  Administered 2020-03-28: 1000 mL via INTRAVENOUS

## 2020-03-28 MED FILL — PROMETHAZINE 25 MG TABLET: 25 | 7 days supply | Qty: 30 | Fill #0

## 2020-03-28 NOTE — ED Notes (Signed)
Reiterated that urine sample was needed. Pt states she is unable to provide at this time.

## 2020-03-28 NOTE — ED Notes (Signed)
Saltines and water given per pt request.

## 2020-03-28 NOTE — ED Notes (Signed)
Pt ambulatory to bathroom to provide urine sample

## 2020-03-28 NOTE — ED Provider Notes (Signed)
[facility] EMERGENCY DEPARTMENT Provider Note   CSN: [id number] Arrival date & time: 03/27/20  1903     History Chief Complaint  Patient presents with  . Vaginal Bleeding  . Vomiting  . Abdominal Pain    Alexandra Nguyen is a 30 y.o. female.  HPI     This is a 30 year old G2, P53 female approximately 7 weeks and 3 days pregnant who presents with 3 days of abdominal cramping, vaginal bleeding, and nausea and vomiting.  She has not taken anything for her symptoms.  She describes light pink vaginal bleeding.  She describes diffuse abdominal discomfort.  She states she has had multiple episodes of nonbilious, nonbloody emesis.  No prior history of hyperemesis with prior pregnancies.  She did have an ultrasound on 12/23 that showed a viable 6-week intrauterine pregnancy with an estimated date of delivery of 11/10/2020.  Patient rates her discomfort at 10 out of 10.  She denies any recent sick contacts or Covid exposures.  He does report some burning chest discomfort that is worse when she vomits.  Past Medical History:  Diagnosis Date  . Allergy   . Anemia   . Gonorrhea   . Trichomoniasis     Patient Active Problem List   Diagnosis Date Noted  . Spontaneous vaginal delivery 03/01/2015  . Active labor 02/28/2015  . Near syncope 08/07/2014  . Dehydration, mild 08/07/2014  . Nausea and vomiting during pregnancy prior to [redacted] weeks gestation 08/07/2014  . Gonorrhea affecting pregnancy in first trimester 08/07/2014    Past Surgical History:  Procedure Laterality Date  . NO PAST SURGERIES       OB History    Gravida  2   Para  1   Term  1   Preterm  0   AB  1   Living  1     SAB  0   IAB  1   Ectopic  0   Multiple  0   Live Births  1           Family History  Problem Relation Age of Onset  . Diabetes Other   . Alcohol abuse Neg Hx   . Asthma Neg Hx   . Arthritis Neg Hx   . Birth defects Neg Hx   . Cancer Neg Hx   . COPD Neg Hx   .  Depression Neg Hx   . Drug abuse Neg Hx   . Early death Neg Hx   . Hearing loss Neg Hx   . Heart disease Neg Hx   . Hyperlipidemia Neg Hx   . Hypertension Neg Hx   . Kidney disease Neg Hx   . Learning disabilities Neg Hx   . Mental illness Neg Hx   . Mental retardation Neg Hx   . Miscarriages / Stillbirths Neg Hx   . Stroke Neg Hx   . Varicose Veins Neg Hx   . Vision loss Neg Hx     Social History   Tobacco Use  . Smoking status: Former Smoker    Packs/day: 0.50    Years: 4.00    Pack years: 2.00    Types: E-cigarettes, Cigarettes    Quit date: 03/26/2018    Years since quitting: 2.0  . Smokeless tobacco: Never Used  Vaping Use  . Vaping Use: Former  Substance Use Topics  . Alcohol use: Not Currently    Comment: social  . Drug use: Not Currently    Comment: 15 years  Home Medications Prior to Admission medications   Medication Sig Start Date End Date Taking? Authorizing Provider  promethazine (PHENERGAN) 25 MG tablet Take 1 tablet (25 mg total) by mouth every 6 (six) hours as needed for nausea or vomiting. 03/28/20  Yes Citlaly Camplin, Mayer Masker, MD  doxycycline (VIBRAMYCIN) 100 MG capsule Take 1 capsule (100 mg total) by mouth 2 (two) times daily. 10/22/19   Molpus, John, MD  norgestimate-ethinyl estradiol (SPRINTEC 28) 0.25-35 MG-MCG tablet Take 1 tablet by mouth daily. 09/16/16   Mesner, Barbara Cower, MD    Allergies    A & d [dimethicone-zinc oxide-vit a-d], Fish allergy, and Latex  Review of Systems   Review of Systems  Constitutional: Negative for fever.  Respiratory: Negative for shortness of breath.   Cardiovascular: Negative for chest pain.  Gastrointestinal: Positive for abdominal pain, nausea and vomiting.  Genitourinary: Positive for vaginal bleeding. Negative for dysuria and vaginal discharge.  All other systems reviewed and are negative.   Physical Exam Updated Vital Signs BP 105/67 (BP Location: Left Arm)   Pulse 60   Temp 98.3 F (36.8 C) (Oral)   Resp 16    Ht 1.727 m (5\' 8" )   Wt 96 kg   SpO2 100%   BMI 32.17 kg/m   Physical Exam Vitals and nursing note reviewed.  Constitutional:      Appearance: She is well-developed and well-nourished.     Comments: Overweight, non-ill-appearing  HENT:     Head: Normocephalic and atraumatic.     Mouth/Throat:     Comments: Mucous membranes dry Eyes:     Pupils: Pupils are equal, round, and reactive to light.  Cardiovascular:     Rate and Rhythm: Normal rate and regular rhythm.     Heart sounds: Normal heart sounds.  Pulmonary:     Effort: Pulmonary effort is normal. No respiratory distress.     Breath sounds: No wheezing.  Abdominal:     General: Bowel sounds are normal.     Palpations: Abdomen is soft.     Tenderness: There is no abdominal tenderness. There is no guarding or rebound.  Genitourinary:    Comments: Deferred, recent pelvic Musculoskeletal:     Cervical back: Neck supple.  Skin:    General: Skin is warm and dry.  Neurological:     Mental Status: She is alert and oriented to person, place, and time.  Psychiatric:        Mood and Affect: Mood and affect and mood normal.     ED Results / Procedures / Treatments   Labs (all labs ordered are listed, but only abnormal results are displayed) Labs Reviewed  COMPREHENSIVE METABOLIC PANEL - Abnormal; Notable for the following components:      Result Value   Sodium 133 (*)    CO2 19 (*)    AST 12 (*)    Total Bilirubin 1.6 (*)    All other components within normal limits  CBC - Abnormal; Notable for the following components:   RBC 5.14 (*)    All other components within normal limits  URINALYSIS, ROUTINE W REFLEX MICROSCOPIC - Abnormal; Notable for the following components:   APPearance HAZY (*)    Hgb urine dipstick SMALL (*)    Bilirubin Urine SMALL (*)    Ketones, ur >=80 (*)    Protein, ur 30 (*)    All other components within normal limits  HCG, QUANTITATIVE, PREGNANCY - Abnormal; Notable for the following  components:   hCG, Beta  Chain, [place], [place] 176,143 (*)    All other components within normal limits  URINALYSIS, MICROSCOPIC (REFLEX) - Abnormal; Notable for the following components:   Bacteria, UA FEW (*)    All other components within normal limits  LIPASE, BLOOD    EKG EKG Interpretation  Date/Time:  Sunday March 27 2020 19:20:03 EST Ventricular Rate:  80 PR Interval:  126 QRS Duration: 94 QT Interval:  372 QTC Calculation: 429 R Axis:   74 Text Interpretation: Sinus rhythm with marked sinus arrhythmia Right atrial enlargement Borderline ECG Confirmed by Birdia Jaycox ([id number]) on 03/28/2020 3:02:09 AM   Radiology No results found.  Procedures Procedures (including critical care time)  Medications Ordered in ED Medications  acetaminophen (TYLENOL) tablet 650 mg (650 mg Oral Not Given 03/28/20 0141)  ondansetron (ZOFRAN) injection 4 mg (4 mg Intravenous Given 03/27/20 1936)  ondansetron (ZOFRAN) injection 4 mg (4 mg Intravenous Given 03/27/20 2301)  ondansetron (ZOFRAN) injection 4 mg (4 mg Intravenous Given 03/28/20 0140)  lactated ringers bolus 1,000 mL (0 mLs Intravenous Stopped 03/28/20 0528)  metoCLOPramide (REGLAN) injection 5 mg (5 mg Intravenous Given 03/28/20 0211)  promethazine (PHENERGAN) injection 12.5 mg (12.5 mg Intravenous Given 03/28/20 0348)    ED Course  I have reviewed the triage vital signs and the nursing notes.  Pertinent labs & imaging results that were available during my care of the patient were reviewed by me and considered in my medical decision making (see chart for details).    MDM Rules/Calculators/A&P                          Patient presents with nausea, vomiting, abdominal pain, vaginal bleeding.  She is overall nontoxic and vital signs are reassuring.  She does clinically appear somewhat dry.  She also reports some burning chest discomfort with vomiting.  Suspect reflux related.  EKG without acute ischemic changes.  Lab work is reviewed from  triage.  No significant metabolic derangements.  Pulmonary exam is reassuring.  Chest x-ray was deferred at this time given pregnancy.  Her hemoglobin is stable at 14.3.  Patient was given fluids.  I attempted bedside ultrasound.  I was able to see intrauterine pregnancy but was not able to document a heart rate given resolution of ultrasound imaging.  I do not have access to formal ultrasound.  She has previously had an ultrasound that confirmed an intrauterine pregnancy.  Given this, her presentation is inconsistent with an ectopic.  Likely a threatened miscarriage.  Urinalysis without UTI.  Repeat beta-hCG levels appropriately elevated from 50,9 77-176,1 43.  This is reassuring.  She will need formal ultrasound to ensure viability.  She was given fluids for dehydration as she had 80 ketones in the urine.  She was also given multiple medications for nausea.  Will discharge on Phenergan.  Recommend close OB follow-up with follow-up ultrasound imaging.  She was given miscarriage precautions.  After history, exam, and medical workup I feel the patient has been appropriately medically screened and is safe for discharge home. Pertinent diagnoses were discussed with the patient. Patient was given return precautions.    final Clinical Impression(s) / ED Diagnoses Final diagnoses:  Threatened miscarriage  Dehydration    Rx / DC Orders ED Discharge Orders         Ordered    promethazine (PHENERGAN) 25 MG tablet  Every 6 hours PRN        01 /03/22 0528  Shon Baton, MD 03/28/20 [phone]

## 2020-03-28 NOTE — Discharge Instructions (Addendum)
You were seen today for bleeding and vomiting in early pregnancy.  Your blood pregnancy levels are reassuring.  You do need to follow-up with your OB/GYN for repeat ultrasound imaging.  Monitor your bleeding closely.  If you begin to pass clots or large amount of bleeding, you need to be reevaluated.  You should go to [facility].  Make sure that you are staying hydrated.  Take Phenergan as needed for nausea.

## 2020-04-11 ENCOUNTER — Emergency Department (HOSPITAL_BASED_OUTPATIENT_CLINIC_OR_DEPARTMENT_OTHER)
Admission: EM | Admit: 2020-04-11 | Discharge: 2020-04-11 | Disposition: A | Discharge status: Home / Self Care | Payer: Medicaid Other | Attending: Emergency Medicine | Admitting: Emergency Medicine

## 2020-04-11 ENCOUNTER — Other Ambulatory Visit: Payer: Self-pay

## 2020-04-11 ENCOUNTER — Encounter (HOSPITAL_BASED_OUTPATIENT_CLINIC_OR_DEPARTMENT_OTHER): Payer: Self-pay | Admitting: *Deleted

## 2020-04-11 DIAGNOSIS — A749 Chlamydial infection, unspecified: Secondary | ICD-10-CM | POA: Insufficient documentation

## 2020-04-11 DIAGNOSIS — A599 Trichomoniasis, unspecified: Secondary | ICD-10-CM

## 2020-04-11 DIAGNOSIS — Z91040 Latex allergy status: Secondary | ICD-10-CM | POA: Insufficient documentation

## 2020-04-11 DIAGNOSIS — Z87891 Personal history of nicotine dependence: Secondary | ICD-10-CM | POA: Insufficient documentation

## 2020-04-11 MED ORDER — LIDOCAINE HCL (PF) 1 % IJ SOLN
1.0000 mL | Freq: Once | INTRAMUSCULAR | Status: AC
  Administered 2020-04-11: 1 mL
  Filled 2020-04-11: qty 5

## 2020-04-11 MED ORDER — CEFTRIAXONE SODIUM 500 MG IJ SOLR
500.0000 mg | Freq: Once | INTRAMUSCULAR | Status: AC
  Administered 2020-04-11: 500 mg via INTRAMUSCULAR
  Filled 2020-04-11: qty 500

## 2020-04-11 MED ORDER — AZITHROMYCIN 250 MG PO TABS
1000.0000 mg | ORAL_TABLET | Freq: Once | ORAL | Status: AC
  Administered 2020-04-11: 1000 mg via ORAL
  Filled 2020-04-11: qty 4

## 2020-04-11 MED ORDER — METRONIDAZOLE 500 MG PO TABS
2000.0000 mg | ORAL_TABLET | Freq: Once | ORAL | Status: AC
  Administered 2020-04-11: 2000 mg via ORAL
  Filled 2020-04-11: qty 4

## 2020-04-11 NOTE — ED Provider Notes (Signed)
[facility] EMERGENCY DEPARTMENT Provider Note   CSN: [id number] Arrival date & time: 04/11/20  1558     History Chief Complaint  Patient presents with  . STD treatment    Alexandra Nguyen is a 30 y.o. female.  HPI   Patient with no significant medical history presents to the emergency department with chief complaint of treatment for STDs.  Patient states she got a phone call from the hospital stating that she tested positive for chlamydia and instructed to come here for further treatment.  She denies urinary symptoms, urinary urgency,  frequency, dysuria, hematuria, vaginal discharge or vaginal bleeding.  Patient endorses that she was recently pregnant and went to [facility] to [facility] pregnancy, she elected for a medicated abortion which started on 01/03.  She endorses that she supposed to follow-up with [facility] this week for further evaluation.  She states that the positive chlamydia test was from December 23rd and is coming in now to be treated.  She has no other complaints at this time.  Patient denies headaches, fevers, chills, shortness of breath, chest pain, abdominal pain, nausea, vomiting, diarrhea, pedal edema.  Past Medical History:  Diagnosis Date  . Allergy   . Anemia   . Gonorrhea   . Trichomoniasis     Patient Active Problem List   Diagnosis Date Noted  . Spontaneous vaginal delivery 03/01/2015  . Active labor 02/28/2015  . Near syncope 08/07/2014  . Dehydration, mild 08/07/2014  . Nausea and vomiting during pregnancy prior to [redacted] weeks gestation 08/07/2014  . Gonorrhea affecting pregnancy in first trimester 08/07/2014    Past Surgical History:  Procedure Laterality Date  . NO PAST SURGERIES       OB History    Gravida  2   Para  1   Term  1   Preterm  0   AB  1   Living  1     SAB  0   IAB  1   Ectopic  0   Multiple  0   Live Births  1           Family History  Problem Relation Age of Onset  .  Diabetes Other   . Alcohol abuse Neg Hx   . Asthma Neg Hx   . Arthritis Neg Hx   . Birth defects Neg Hx   . Cancer Neg Hx   . COPD Neg Hx   . Depression Neg Hx   . Drug abuse Neg Hx   . Early death Neg Hx   . Hearing loss Neg Hx   . Heart disease Neg Hx   . Hyperlipidemia Neg Hx   . Hypertension Neg Hx   . Kidney disease Neg Hx   . Learning disabilities Neg Hx   . Mental illness Neg Hx   . Mental retardation Neg Hx   . Miscarriages / Stillbirths Neg Hx   . Stroke Neg Hx   . Varicose Veins Neg Hx   . Vision loss Neg Hx     Social History   Tobacco Use  . Smoking status: Former Smoker    Packs/day: 0.50    Years: 4.00    Pack years: 2.00    Types: E-cigarettes, Cigarettes    Quit date: 03/26/2018    Years since quitting: 2.0  . Smokeless tobacco: Never Used  Vaping Use  . Vaping Use: Former  Substance Use Topics  . Alcohol use: Not Currently    Comment: social  .  Drug use: Not Currently    Comment: 15 years    Home Medications Prior to Admission medications   Medication Sig Start Date End Date Taking? Authorizing Provider  doxycycline (VIBRAMYCIN) 100 MG capsule Take 1 capsule (100 mg total) by mouth 2 (two) times daily. 10/22/19   Molpus, John, MD  norgestimate-ethinyl estradiol (SPRINTEC 28) 0.25-35 MG-MCG tablet Take 1 tablet by mouth daily. 09/16/16   Mesner, Barbara Cower, MD  promethazine (PHENERGAN) 25 MG tablet Take 1 tablet (25 mg total) by mouth every 6 (six) hours as needed for nausea or vomiting. 03/28/20   Horton, Mayer Masker, MD    Allergies    A & d [dimethicone-zinc oxide-vit a-d], Fish allergy, and Latex  Review of Systems   Review of Systems  Constitutional: Negative for chills and fever.  HENT: Negative for congestion.   Respiratory: Negative for shortness of breath.   Cardiovascular: Negative for chest pain.  Gastrointestinal: Negative for abdominal pain.  Genitourinary: Negative for enuresis.  Musculoskeletal: Negative for back pain.  Skin: Negative  for rash.  Neurological: Negative for dizziness.  Hematological: Does not bruise/bleed easily.    Physical Exam Updated Vital Signs BP 119/86 (BP Location: Right Arm)   Pulse 82   Temp 98.4 F (36.9 C) (Oral)   Resp 16   Ht 5\' 8"  (1.727 m)   Wt 96 kg   LMP 02/01/2020   SpO2 100%   BMI 32.17 kg/m   Physical Exam Vitals and nursing note reviewed.  Constitutional:      General: She is not in acute distress.    Appearance: She is not ill-appearing.  HENT:     Head: Normocephalic and atraumatic.     Nose: No congestion.  Eyes:     Conjunctiva/sclera: Conjunctivae normal.  Cardiovascular:     Rate and Rhythm: Normal rate.  Pulmonary:     Effort: Pulmonary effort is normal.  Musculoskeletal:     Comments: Patient moving all 4 extremities out difficulty.  Skin:    General: Skin is warm and dry.  Neurological:     Mental Status: She is alert.  Psychiatric:        Mood and Affect: Mood normal.     ED Results / Procedures / Treatments   Labs (all labs ordered are listed, but only abnormal results are displayed) Labs Reviewed - No data to display  EKG None  Radiology No results found.  Procedures Procedures (including critical care time)  Medications Ordered in ED Medications  azithromycin (ZITHROMAX) tablet 1,000 mg (has no administration in time range)  cefTRIAXone (ROCEPHIN) injection 500 mg (has no administration in time range)  lidocaine (PF) (XYLOCAINE) 1 % injection 1 mL (has no administration in time range)  metroNIDAZOLE (FLAGYL) tablet 2,000 mg (has no administration in time range)    ED Course  I have reviewed the triage vital signs and the nursing notes.  Pertinent labs & imaging results that were available during my care of the patient were reviewed by me and considered in my medical decision making (see chart for details).    MDM Rules/Calculators/A&P                          Patient presents for treatment of STDs.  She is alert, does not  appear to be in acute distress, vital signs reassuring.  Due to well-appearing patient, benign physical exam further lab and imaging are not warranted at this time.   will go  ahead and treat patient for gonorrhea and chlamydia, will provide her with azithromycin for chlamydia versus doxycycline as doxycycline has adverse effects to intrauterine pregnancy.  Will provide patient with Flagyl as this will treat her for possible trichomonas she states when patient was treated for trichomonas back in December 23 she vomited immediately after and is unsure whether or not it got into her system.   low suspicion for systemic infection as patient is nontoxic-appearing, vital signs reassuring, no obvious source infection on my exam.  Low suspicion for intra-abdominal abnormality requiring immediate intervention as patient denies abdominal pain, tolerating p.o. without difficulty.  Low suspicion for UTI or pyelonephritis as patient denies abdominal pain, urinary symptoms.  Will recommend patient abstain from sexual intercourse for the next 7 days, practice safe sex practices and follow-up with health department for future STD treatment and screenings  Vital signs have remained stable, no indication for hospital admission.  Patient discussed with attending and they agreed with assessment and plan.  Patient given at home care as well strict return precautions.  Patient verbalized that they understood agreed to said plan.  Final Clinical Impression(s) / ED Diagnoses Final diagnoses:  Chlamydia infection  Trichomonas infection    Rx / DC Orders ED Discharge Orders    None       Carroll Sage, PA-C 04/11/20 1804    Pollyann Savoy, MD 04/11/20 [phone]

## 2020-04-11 NOTE — Discharge Instructions (Addendum)
You treated for gonorrhea, chlamydia, trichomonas.  Recommend abstain from sexual intercourse for the next 7 days, please institute safe sex practices this will prevent future STD exposures.  Please follow-up with the health department as they will treat you and examine you for free.  Come back to the emergency department if you develop chest pain, shortness of breath, severe abdominal pain, uncontrolled nausea, vomiting, diarrhea.

## 2020-04-11 NOTE — ED Triage Notes (Signed)
Called by staff to come here for STD treatment.

## 2020-04-14 ENCOUNTER — Encounter: Payer: Medicaid Other | Admitting: Student

## 2020-08-02 ENCOUNTER — Other Ambulatory Visit: Payer: Self-pay

## 2020-08-02 ENCOUNTER — Encounter (HOSPITAL_BASED_OUTPATIENT_CLINIC_OR_DEPARTMENT_OTHER): Payer: Self-pay | Admitting: *Deleted

## 2020-08-02 DIAGNOSIS — Z87891 Personal history of nicotine dependence: Secondary | ICD-10-CM | POA: Insufficient documentation

## 2020-08-02 DIAGNOSIS — Z91040 Latex allergy status: Secondary | ICD-10-CM | POA: Insufficient documentation

## 2020-08-02 DIAGNOSIS — N898 Other specified noninflammatory disorders of vagina: Secondary | ICD-10-CM | POA: Insufficient documentation

## 2020-08-02 NOTE — ED Triage Notes (Signed)
C/o vaginal discharge x 1 week  

## 2020-08-03 ENCOUNTER — Emergency Department (HOSPITAL_BASED_OUTPATIENT_CLINIC_OR_DEPARTMENT_OTHER)
Admission: EM | Admit: 2020-08-03 | Discharge: 2020-08-03 | Disposition: A | Discharge status: Home / Self Care | Payer: Medicaid Other | Attending: Emergency Medicine | Admitting: Emergency Medicine

## 2020-08-03 DIAGNOSIS — N898 Other specified noninflammatory disorders of vagina: Secondary | ICD-10-CM

## 2020-08-03 HISTORY — DX: Chlamydial infection, unspecified: A74.9

## 2020-08-03 LAB — URINALYSIS, MICROSCOPIC (REFLEX)

## 2020-08-03 LAB — URINALYSIS, ROUTINE W REFLEX MICROSCOPIC
Bilirubin Urine: NEGATIVE
Glucose, UA: NEGATIVE mg/dL
Ketones, ur: NEGATIVE mg/dL
Leukocytes,Ua: NEGATIVE
Nitrite: NEGATIVE
Protein, ur: NEGATIVE mg/dL
Specific Gravity, Urine: 1.025 (ref 1.005–1.030)
pH: 6 (ref 5.0–8.0)

## 2020-08-03 LAB — WET PREP, GENITAL
Clue Cells Wet Prep HPF POC: NONE SEEN
Sperm: NONE SEEN
Trich, Wet Prep: NONE SEEN
Yeast Wet Prep HPF POC: NONE SEEN

## 2020-08-03 LAB — PREGNANCY, URINE: Preg Test, Ur: NEGATIVE

## 2020-08-03 NOTE — Discharge Instructions (Addendum)
Your pelvic exam did not show clear infection.  There were however some white cells with it.  Gonorrhea and Chlamydia testing are still pending you will be notified if they are positive.  Follow-up with your gynecologist if symptoms do not improve.

## 2020-08-03 NOTE — ED Provider Notes (Signed)
[facility] EMERGENCY DEPARTMENT Provider Note   CSN: [id number] Arrival date & time: 08/02/20  2342     History Chief Complaint  Patient presents with  . Vaginal Discharge    Alexandra Nguyen is a 30 y.o. female.  HPI Patient presents with vaginal discharge.  States that started few days ago.  States that previously had had trichomoniasis and chlamydia.  Was treated for that.  Testing was in December but treatment was in January.  States she has not had sex since then.  Started with some discharge now.  Seems similar before.  States there is some slight burning.  No fevers or chills.    Past Medical History:  Diagnosis Date  . Allergy   . Anemia   . Chlamydia   . Gonorrhea   . Trichomoniasis     Patient Active Problem List   Diagnosis Date Noted  . Spontaneous vaginal delivery 03/01/2015  . Active labor 02/28/2015  . Near syncope 08/07/2014  . Dehydration, mild 08/07/2014  . Nausea and vomiting during pregnancy prior to [redacted] weeks gestation 08/07/2014  . Gonorrhea affecting pregnancy in first trimester 08/07/2014    Past Surgical History:  Procedure Laterality Date  . NO PAST SURGERIES       OB History    Gravida  2   Para  1   Term  1   Preterm  0   AB  1   Living  1     SAB  0   IAB  1   Ectopic  0   Multiple  0   Live Births  1           Family History  Problem Relation Age of Onset  . Diabetes Other   . Alcohol abuse Neg Hx   . Asthma Neg Hx   . Arthritis Neg Hx   . Birth defects Neg Hx   . Cancer Neg Hx   . COPD Neg Hx   . Depression Neg Hx   . Drug abuse Neg Hx   . Early death Neg Hx   . Hearing loss Neg Hx   . Heart disease Neg Hx   . Hyperlipidemia Neg Hx   . Hypertension Neg Hx   . Kidney disease Neg Hx   . Learning disabilities Neg Hx   . Mental illness Neg Hx   . Mental retardation Neg Hx   . Miscarriages / Stillbirths Neg Hx   . Stroke Neg Hx   . Varicose Veins Neg Hx   . Vision loss Neg Hx      Social History   Tobacco Use  . Smoking status: Former Smoker    Packs/day: 0.50    Years: 4.00    Pack years: 2.00    Types: E-cigarettes, Cigarettes    Quit date: 03/26/2018    Years since quitting: 2.3  . Smokeless tobacco: Never Used  Vaping Use  . Vaping Use: Former  Substance Use Topics  . Alcohol use: Not Currently    Comment: social  . Drug use: Not Currently    Comment: 15 years    Home Medications Prior to Admission medications   Medication Sig Start Date End Date Taking? Authorizing Provider  doxycycline (VIBRAMYCIN) 100 MG capsule Take 1 capsule (100 mg total) by mouth 2 (two) times daily. 10/22/19   Molpus, John, MD  norgestimate-ethinyl estradiol (SPRINTEC 28) 0.25-35 MG-MCG tablet Take 1 tablet by mouth daily. 09/16/16   Mesner, Barbara Cower, MD  promethazine [facility])  25 MG tablet TAKE 1 TABLET BY MOUTH EVERY 6 HOURS AS NEEDED FOR NAUSEA AND/OR VOMITING 03/28/20 03/28/21  Horton, Mayer Masker, MD    Allergies    A & d [dimethicone-zinc oxide-vit a-d], Fish allergy, and Latex  Review of Systems   Review of Systems  Constitutional: Negative for appetite change.  Respiratory: Negative for shortness of breath.   Cardiovascular: Negative for chest pain.  Gastrointestinal: Negative for abdominal pain.  Genitourinary: Positive for vaginal discharge. Negative for decreased urine volume and vaginal bleeding.  Skin: Negative for rash.  Neurological: Negative for weakness.    Physical Exam Updated Vital Signs BP 116/81   Pulse (!) 59   Temp 98.3 F (36.8 C) (Oral)   Resp 16   Ht 5\' 9"  (1.753 m)   Wt 97.5 kg   LMP 07/26/2020   SpO2 99%   BMI 31.75 kg/m   Physical Exam Vitals and nursing note reviewed.  Cardiovascular:     Rate and Rhythm: Regular rhythm.  Abdominal:     Tenderness: There is no abdominal tenderness.  Genitourinary:    Vagina: No vaginal discharge.     Comments: Minimal vaginal discharge.  No cervical discharge.  No cervical motion  tenderness. Skin:    General: Skin is warm.     Capillary Refill: Capillary refill takes less than 2 seconds.  Neurological:     Mental Status: She is alert and oriented to person, place, and time.     ED Results / Procedures / Treatments   Labs (all labs ordered are listed, but only abnormal results are displayed) Labs Reviewed  WET PREP, GENITAL - Abnormal; Notable for the following components:      Result Value   WBC, Wet Prep HPF POC MODERATE (*)    All other components within normal limits  URINALYSIS, ROUTINE W REFLEX MICROSCOPIC - Abnormal; Notable for the following components:   Hgb urine dipstick MODERATE (*)    All other components within normal limits  URINALYSIS, MICROSCOPIC (REFLEX) - Abnormal; Notable for the following components:   Bacteria, UA RARE (*)    All other components within normal limits  PREGNANCY, URINE  GC/CHLAMYDIA PROBE AMP ([place]) NOT AT [facility]    EKG None  Radiology No results found.  Procedures Procedures   Medications Ordered in ED Medications - No data to display  ED Course  I have reviewed the triage vital signs and the nursing notes.  Pertinent labs & imaging results that were available during my care of the patient were reviewed by me and considered in my medical decision making (see chart for details).    MDM Rules/Calculators/A&P                          Patient presents with vaginal discharge.  Benign physical exam.  Really no discharge.  There were some white cells on wet prep.  No trichomoniasis.  Gonorrhea and Chlamydia still pending, denies having sexual intercourse since last treated.  Do not think we need to empirically treat with minimal discharge in all there are some white cells.  Gonorrhea chlamydia still pending however.  Discharge home and will follow up with her OB if symptoms do not improve Final Clinical Impression(s) / ED Diagnoses Final diagnoses:  Vaginal discharge    Rx / DC Orders ED Discharge  Orders    None       [facility], MD 08/03/20 0505

## 2020-08-04 ENCOUNTER — Encounter (HOSPITAL_BASED_OUTPATIENT_CLINIC_OR_DEPARTMENT_OTHER): Payer: Self-pay | Admitting: *Deleted

## 2020-08-04 ENCOUNTER — Emergency Department (HOSPITAL_BASED_OUTPATIENT_CLINIC_OR_DEPARTMENT_OTHER)
Admission: EM | Admit: 2020-08-04 | Discharge: 2020-08-04 | Disposition: A | Discharge status: Home / Self Care | Payer: Medicaid Other | Attending: Emergency Medicine | Admitting: Emergency Medicine

## 2020-08-04 ENCOUNTER — Encounter (HOSPITAL_BASED_OUTPATIENT_CLINIC_OR_DEPARTMENT_OTHER): Payer: Self-pay

## 2020-08-04 ENCOUNTER — Other Ambulatory Visit (HOSPITAL_BASED_OUTPATIENT_CLINIC_OR_DEPARTMENT_OTHER): Payer: Self-pay

## 2020-08-04 ENCOUNTER — Other Ambulatory Visit: Payer: Self-pay

## 2020-08-04 DIAGNOSIS — Z87891 Personal history of nicotine dependence: Secondary | ICD-10-CM | POA: Insufficient documentation

## 2020-08-04 DIAGNOSIS — T192XXA Foreign body in vulva and vagina, initial encounter: Secondary | ICD-10-CM | POA: Insufficient documentation

## 2020-08-04 DIAGNOSIS — Z202 Contact with and (suspected) exposure to infections with a predominantly sexual mode of transmission: Secondary | ICD-10-CM

## 2020-08-04 DIAGNOSIS — A749 Chlamydial infection, unspecified: Secondary | ICD-10-CM | POA: Insufficient documentation

## 2020-08-04 DIAGNOSIS — Z91040 Latex allergy status: Secondary | ICD-10-CM | POA: Insufficient documentation

## 2020-08-04 DIAGNOSIS — X58XXXA Exposure to other specified factors, initial encounter: Secondary | ICD-10-CM | POA: Insufficient documentation

## 2020-08-04 LAB — GC/CHLAMYDIA PROBE AMP (~~LOC~~) NOT AT ARMC
Chlamydia: POSITIVE — AB
Comment: NEGATIVE
Comment: NORMAL
Neisseria Gonorrhea: NEGATIVE

## 2020-08-04 MED ORDER — DOXYCYCLINE HYCLATE 100 MG PO CAPS
100.0000 mg | ORAL_CAPSULE | Freq: Two times a day (BID) | ORAL | 0 refills | Status: DC
  Filled 2020-08-04: qty 14, 7d supply, fill #0

## 2020-08-04 MED ORDER — METRONIDAZOLE 500 MG PO TABS
2000.0000 mg | ORAL_TABLET | Freq: Once | ORAL | Status: AC
  Administered 2020-08-04: 2000 mg via ORAL
  Filled 2020-08-04: qty 4

## 2020-08-04 MED ORDER — CEFTRIAXONE SODIUM 500 MG IJ SOLR
500.0000 mg | Freq: Once | INTRAMUSCULAR | Status: AC
  Administered 2020-08-04: 500 mg via INTRAMUSCULAR
  Filled 2020-08-04: qty 500

## 2020-08-04 MED ORDER — FLUCONAZOLE 150 MG PO TABS
150.0000 mg | ORAL_TABLET | Freq: Every day | ORAL | 0 refills | Status: DC
  Filled 2020-08-04: qty 2, 3d supply, fill #0

## 2020-08-04 MED ORDER — LIDOCAINE HCL (PF) 1 % IJ SOLN
INTRAMUSCULAR | Status: AC
  Administered 2020-08-04: 1 mL
  Filled 2020-08-04: qty 5

## 2020-08-04 NOTE — ED Provider Notes (Signed)
[facility] EMERGENCY DEPARTMENT Provider Note   CSN: [id number] Arrival date & time: 08/04/20  1530     History Chief Complaint  Patient presents with  . SEXUALLY TRANSMITTED DISEASE    Alexandra Nguyen is a 30 y.o. female.  Patient with history of gonorrhea, chlamydia, trichomonas infection --presents the emergency department after testing positive for chlamydia on 08/02/2020.  Patient states that she would like to be treated for chlamydia, gonorrhea, and trichomonas today.  She states that she thinks she caught these infections from a partner who she had in the past.  She states that she is concerned that her infections do not clear up with previous treatment.  She has discharge.  No abdominal pain or fevers.  She denies other complaints.  States that she was test tested for HIV and syphilis in January and does not want testing today.        Past Medical History:  Diagnosis Date  . Allergy   . Anemia   . Chlamydia   . Gonorrhea   . Trichomoniasis     Patient Active Problem List   Diagnosis Date Noted  . Spontaneous vaginal delivery 03/01/2015  . Active labor 02/28/2015  . Near syncope 08/07/2014  . Dehydration, mild 08/07/2014  . Nausea and vomiting during pregnancy prior to [redacted] weeks gestation 08/07/2014  . Gonorrhea affecting pregnancy in first trimester 08/07/2014    Past Surgical History:  Procedure Laterality Date  . NO PAST SURGERIES       OB History    Gravida  2   Para  1   Term  1   Preterm  0   AB  1   Living  1     SAB  0   IAB  1   Ectopic  0   Multiple  0   Live Births  1           Family History  Problem Relation Age of Onset  . Diabetes Other   . Alcohol abuse Neg Hx   . Asthma Neg Hx   . Arthritis Neg Hx   . Birth defects Neg Hx   . Cancer Neg Hx   . COPD Neg Hx   . Depression Neg Hx   . Drug abuse Neg Hx   . Early death Neg Hx   . Hearing loss Neg Hx   . Heart disease Neg Hx   . Hyperlipidemia Neg Hx    . Hypertension Neg Hx   . Kidney disease Neg Hx   . Learning disabilities Neg Hx   . Mental illness Neg Hx   . Mental retardation Neg Hx   . Miscarriages / Stillbirths Neg Hx   . Stroke Neg Hx   . Varicose Veins Neg Hx   . Vision loss Neg Hx     Social History   Tobacco Use  . Smoking status: Former Smoker    Packs/day: 0.50    Years: 4.00    Pack years: 2.00    Types: E-cigarettes, Cigarettes    Quit date: 03/26/2018    Years since quitting: 2.3  . Smokeless tobacco: Never Used  Vaping Use  . Vaping Use: Former  Substance Use Topics  . Alcohol use: Not Currently    Comment: social  . Drug use: Not Currently    Comment: 15 years    Home Medications Prior to Admission medications   Medication Sig Start Date End Date Taking? Authorizing Provider  doxycycline (VIBRAMYCIN) 100 MG  capsule Take 1 capsule (100 mg total) by mouth 2 (two) times daily. 10/22/19   Molpus, John, MD  norgestimate-ethinyl estradiol (SPRINTEC 28) 0.25-35 MG-MCG tablet Take 1 tablet by mouth daily. 09/16/16   Mesner, Barbara Cower, MD  promethazine (PHENERGAN) 25 MG tablet TAKE 1 TABLET BY MOUTH EVERY 6 HOURS AS NEEDED FOR NAUSEA AND/OR VOMITING 03/28/20 03/28/21  Horton, Mayer Masker, MD    Allergies    A & d [dimethicone-zinc oxide-vit a-d], Fish allergy, and Latex  Review of Systems   Review of Systems  Constitutional: Negative for fever.  HENT: Negative for sore throat.   Eyes: Negative for discharge.  Gastrointestinal: Negative for rectal pain.  Genitourinary: Positive for vaginal discharge. Negative for dysuria, frequency, genital sores, pelvic pain and vaginal bleeding.  Musculoskeletal: Negative for arthralgias.  Skin: Negative for rash.  Hematological: Negative for adenopathy.    Physical Exam Updated Vital Signs BP 119/70 (BP Location: Left Arm)   Pulse 86   Temp 98.3 F (36.8 C) (Oral)   Resp 18   Ht 5\' 9"  (1.753 m)   Wt 97.5 kg   LMP 07/26/2020   SpO2 100%   BMI 31.75 kg/m   Physical  Exam Vitals and nursing note reviewed.  Constitutional:      Appearance: She is well-developed.  HENT:     Head: Normocephalic and atraumatic.  Eyes:     Conjunctiva/sclera: Conjunctivae normal.  Pulmonary:     Effort: No respiratory distress.  Musculoskeletal:     Cervical back: Normal range of motion and neck supple.  Skin:    General: Skin is warm and dry.  Neurological:     Mental Status: She is alert.     ED Results / Procedures / Treatments   Labs (all labs ordered are listed, but only abnormal results are displayed) Labs Reviewed - No data to display  EKG None  Radiology No results found.  Procedures Procedures   Medications Ordered in ED Medications  cefTRIAXone (ROCEPHIN) injection 500 mg (has no administration in time range)  metroNIDAZOLE (FLAGYL) tablet 2,000 mg (has no administration in time range)    ED Course  I have reviewed the triage vital signs and the nursing notes.  Pertinent labs & imaging results that were available during my care of the patient were reviewed by me and considered in my medical decision making (see chart for details).  Patient seen and examined.  I reviewed patient's lab results with her at bedside.  We discussed positive chlamydia, negative gonorrhea, negative trichomoniasis on wet prep.  We discussed risks and benefits of antibiotics that could be unnecessary given her recent negative testing.  Despite this, she states that she would prefer that she receive therapy for all of these, for peace of mind and to make sure that her infection is completely cleared up.  Will prescribe 2 g of metronidazole and 500 mg of IM Rocephin here.  We discussed treatment with oral azithromycin versus 1 week course of doxycycline for chlamydia infection.  She would prefer the 2-week course given that it is more efficacious and less likely to result in treatment failure.  She will also need Diflucan for yeast infection.  Will test and treat for STD  exposure. Patient offered HIV and syphilis testing. Patient counseled on safe sexual practices. Told them that they should not have sexual contact for next 7 days and that they need to inform sexual partners so that they can get tested and treated as well. Patient verbalizes understanding  and agrees with plan.    Vital signs reviewed and are as follows: BP 119/70 (BP Location: Left Arm)   Pulse 86   Temp 98.3 F (36.8 C) (Oral)   Resp 18   Ht 5\' 9"  (1.753 m)   Wt 97.5 kg   LMP 07/26/2020   SpO2 100%   BMI 31.75 kg/m      MDM Rules/Calculators/A&P                          Patient treated for sexual transmitted infections in light of recent positive chlamydia test.  Discussion with patient as above.   Final Clinical Impression(s) / ED Diagnoses Final diagnoses:  Chlamydia infection  STD exposure    Rx / DC Orders ED Discharge Orders    None       09/25/2020, PA-C 08/04/20 1615    10/04/20, MD 08/04/20 [phone]

## 2020-08-04 NOTE — ED Triage Notes (Signed)
Pt here for STD treatment. Tested positive for STDs 3 days ago.

## 2020-08-04 NOTE — Discharge Instructions (Addendum)
Return to the ER for any new and/or concerning symptoms. 

## 2020-08-04 NOTE — Discharge Instructions (Signed)
Please read and follow all provided instructions.  Your diagnoses today include:  1. Chlamydia infection   2. STD exposure     Tests performed today include:  Test for gonorrhea and chlamydia.   Test for HIV and syphilis.   You will be notified by telephone with any positive results.   Vital signs. See below for your results today.   Medications:  For treatment of gonorrhea: You were treated with a rocephin (shot) today.   For treatment of chlamydia: You were given doxycycline for treatment today -- you need to take this twice a day for 1 week.   You were also given metronidazole (pills) that treat for trichomonas. Avoid alcohol when taking this medication as it can cause severe vomiting.  Home care instructions:  Read educational materials contained in this packet and follow any instructions provided.   You should tell your partners about your infection and avoid having sex for one week to allow time for the medicine to work.  Sexually transmitted disease testing also available at:   [facility] [place]  [address], [place], phone [phone] or [phone]    Monday - Friday, call for an appointment  Return instructions:   Please return to the Emergency Department if you experience worsening symptoms.   Please return if you have any other emergent concerns.  Additional Information:  Your vital signs today were: BP 119/70 (BP Location: Left Arm)   Pulse 86   Temp 98.3 F (36.8 C) (Oral)   Resp 18   Ht 5\' 9"  (1.753 m)   Wt 97.5 kg   LMP 07/26/2020   SpO2 100%   BMI 31.75 kg/m  If your blood pressure (BP) was elevated above 135/85 this visit, please have this repeated by your doctor within one month. --------------

## 2020-08-04 NOTE — ED Triage Notes (Signed)
Pt states she thinks there is condom in her vagina.

## 2020-08-04 NOTE — ED Provider Notes (Signed)
[facility] EMERGENCY DEPARTMENT Provider Note   CSN: [id number] Arrival date & time: 08/04/20  0131     History Chief Complaint  Patient presents with  . Foreign Body in Vagina    Alexandra Nguyen is a 30 y.o. female.  Patient is a 30 year old female presenting with a condom stuck in her vagina following intercourse with her significant other.  Patient has no other complaints.  She was seen here yesterday with vaginal discharge and cultures are pending.  The history is provided by the patient.  Foreign Body in Vagina This is a new problem. The current episode started less than 1 hour ago. The problem occurs constantly. The problem has not changed since onset.Nothing aggravates the symptoms. Nothing relieves the symptoms. She has tried nothing for the symptoms.       Past Medical History:  Diagnosis Date  . Allergy   . Anemia   . Chlamydia   . Gonorrhea   . Trichomoniasis     Patient Active Problem List   Diagnosis Date Noted  . Spontaneous vaginal delivery 03/01/2015  . Active labor 02/28/2015  . Near syncope 08/07/2014  . Dehydration, mild 08/07/2014  . Nausea and vomiting during pregnancy prior to [redacted] weeks gestation 08/07/2014  . Gonorrhea affecting pregnancy in first trimester 08/07/2014    Past Surgical History:  Procedure Laterality Date  . NO PAST SURGERIES       OB History    Gravida  2   Para  1   Term  1   Preterm  0   AB  1   Living  1     SAB  0   IAB  1   Ectopic  0   Multiple  0   Live Births  1           Family History  Problem Relation Age of Onset  . Diabetes Other   . Alcohol abuse Neg Hx   . Asthma Neg Hx   . Arthritis Neg Hx   . Birth defects Neg Hx   . Cancer Neg Hx   . COPD Neg Hx   . Depression Neg Hx   . Drug abuse Neg Hx   . Early death Neg Hx   . Hearing loss Neg Hx   . Heart disease Neg Hx   . Hyperlipidemia Neg Hx   . Hypertension Neg Hx   . Kidney disease Neg Hx   . Learning  disabilities Neg Hx   . Mental illness Neg Hx   . Mental retardation Neg Hx   . Miscarriages / Stillbirths Neg Hx   . Stroke Neg Hx   . Varicose Veins Neg Hx   . Vision loss Neg Hx     Social History   Tobacco Use  . Smoking status: Former Smoker    Packs/day: 0.50    Years: 4.00    Pack years: 2.00    Types: E-cigarettes, Cigarettes    Quit date: 03/26/2018    Years since quitting: 2.3  . Smokeless tobacco: Never Used  Vaping Use  . Vaping Use: Former  Substance Use Topics  . Alcohol use: Not Currently    Comment: social  . Drug use: Not Currently    Comment: 15 years    Home Medications Prior to Admission medications   Medication Sig Start Date End Date Taking? Authorizing Provider  doxycycline (VIBRAMYCIN) 100 MG capsule Take 1 capsule (100 mg total) by mouth 2 (two) times daily. 10/22/19  Molpus, John, MD  norgestimate-ethinyl estradiol (SPRINTEC 28) 0.25-35 MG-MCG tablet Take 1 tablet by mouth daily. 09/16/16   Mesner, Barbara Cower, MD  promethazine (PHENERGAN) 25 MG tablet TAKE 1 TABLET BY MOUTH EVERY 6 HOURS AS NEEDED FOR NAUSEA AND/OR VOMITING 03/28/20 03/28/21  Horton, Mayer Masker, MD    Allergies    A & d [dimethicone-zinc oxide-vit a-d], Fish allergy, and Latex  Review of Systems   Review of Systems  All other systems reviewed and are negative.   Physical Exam Updated Vital Signs BP 130/85   Pulse 88   Temp 98.1 F (36.7 C) (Oral)   Resp 18   Ht 5\' 9"  (1.753 m)   Wt 97 kg   LMP 07/26/2020   SpO2 99%   BMI 31.58 kg/m   Physical Exam Vitals and nursing note reviewed.  HENT:     Head: Normocephalic.  Pulmonary:     Effort: Pulmonary effort is normal.  Genitourinary:    Vagina: No vaginal discharge.     Comments: Condom present in vagina and was removed. Skin:    General: Skin is warm and dry.  Neurological:     Mental Status: She is alert.     ED Results / Procedures / Treatments   Labs (all labs ordered are listed, but only abnormal results are  displayed) Labs Reviewed - No data to display  EKG None  Radiology No results found.  Procedures Procedures   Medications Ordered in ED Medications - No data to display  ED Course  I have reviewed the triage vital signs and the nursing notes.  Pertinent labs & imaging results that were available during my care of the patient were reviewed by me and considered in my medical decision making (see chart for details).    MDM Rules/Calculators/A&P  Condom successfully extracted from vagina.  To return as needed.  Final Clinical Impression(s) / ED Diagnoses Final diagnoses:  None    Rx / DC Orders ED Discharge Orders    None       09/25/2020, MD 08/04/20 0147

## 2020-08-04 NOTE — ED Notes (Signed)
Pt states she is here for treatment for STDS, + chlamydia  meds given per MD order, plan to dc 30 min after IM injection

## 2020-09-16 ENCOUNTER — Emergency Department (HOSPITAL_BASED_OUTPATIENT_CLINIC_OR_DEPARTMENT_OTHER)
Admission: EM | Admit: 2020-09-16 | Discharge: 2020-09-16 | Disposition: A | Discharge status: Home / Self Care | Payer: Medicaid Other | Attending: Emergency Medicine | Admitting: Emergency Medicine

## 2020-09-16 ENCOUNTER — Other Ambulatory Visit: Payer: Self-pay

## 2020-09-16 DIAGNOSIS — Z91040 Latex allergy status: Secondary | ICD-10-CM | POA: Insufficient documentation

## 2020-09-16 DIAGNOSIS — Z87891 Personal history of nicotine dependence: Secondary | ICD-10-CM | POA: Diagnosis not present

## 2020-09-16 DIAGNOSIS — R110 Nausea: Secondary | ICD-10-CM | POA: Diagnosis not present

## 2020-09-16 DIAGNOSIS — R1031 Right lower quadrant pain: Secondary | ICD-10-CM | POA: Diagnosis not present

## 2020-09-16 DIAGNOSIS — N898 Other specified noninflammatory disorders of vagina: Secondary | ICD-10-CM | POA: Insufficient documentation

## 2020-09-16 LAB — URINALYSIS, ROUTINE W REFLEX MICROSCOPIC
Bilirubin Urine: NEGATIVE
Glucose, UA: NEGATIVE mg/dL
Ketones, ur: NEGATIVE mg/dL
Leukocytes,Ua: NEGATIVE
Nitrite: NEGATIVE
Protein, ur: NEGATIVE mg/dL
Specific Gravity, Urine: 1.025 (ref 1.005–1.030)
pH: 6 (ref 5.0–8.0)

## 2020-09-16 LAB — COMPREHENSIVE METABOLIC PANEL
ALT: 14 U/L (ref 0–44)
AST: 16 U/L (ref 15–41)
Albumin: 3.7 g/dL (ref 3.5–5.0)
Alkaline Phosphatase: 54 U/L (ref 38–126)
Anion gap: 6 (ref 5–15)
BUN: 13 mg/dL (ref 6–20)
CO2: 22 mmol/L (ref 22–32)
Calcium: 8.7 mg/dL — ABNORMAL LOW (ref 8.9–10.3)
Chloride: 108 mmol/L (ref 98–111)
Creatinine, Ser: 0.68 mg/dL (ref 0.44–1.00)
GFR, Estimated: >60 mL/min (ref >60)
Glucose, Bld: 94 mg/dL (ref 70–99)
Potassium: 3.8 mmol/L (ref 3.5–5.1)
Sodium: 136 mmol/L (ref 135–145)
Total Bilirubin: 1.7 mg/dL — ABNORMAL HIGH (ref 0.3–1.2)
Total Protein: 6.7 g/dL (ref 6.5–8.1)

## 2020-09-16 LAB — URINALYSIS, MICROSCOPIC (REFLEX)

## 2020-09-16 LAB — CBC WITH DIFFERENTIAL/PLATELET
Abs Immature Granulocytes: 0.01 10*3/uL (ref 0.00–0.07)
Basophils Absolute: 0 10*3/uL (ref 0.0–0.1)
Basophils Relative: 1 %
Eosinophils Absolute: 0.1 10*3/uL (ref 0.0–0.5)
Eosinophils Relative: 2 %
HCT: 39 % (ref 36.0–46.0)
Hemoglobin: 12.7 g/dL (ref 12.0–15.0)
Immature Granulocytes: 0 %
Lymphocytes Relative: 45 %
Lymphs Abs: 2.7 10*3/uL (ref 0.7–4.0)
MCH: 28.7 pg (ref 26.0–34.0)
MCHC: 32.6 g/dL (ref 30.0–36.0)
MCV: 88 fL (ref 80.0–100.0)
Monocytes Absolute: 0.4 10*3/uL (ref 0.1–1.0)
Monocytes Relative: 6 %
Neutro Abs: 2.8 10*3/uL (ref 1.7–7.7)
Neutrophils Relative %: 46 %
Platelets: 227 10*3/uL (ref 150–400)
RBC: 4.43 MIL/uL (ref 3.87–5.11)
RDW: 14 % (ref 11.5–15.5)
WBC: 6 10*3/uL (ref 4.0–10.5)
nRBC: 0 % (ref 0.0–0.2)

## 2020-09-16 LAB — WET PREP, GENITAL
Clue Cells Wet Prep HPF POC: NONE SEEN
Sperm: NONE SEEN
Trich, Wet Prep: NONE SEEN
Yeast Wet Prep HPF POC: NONE SEEN

## 2020-09-16 LAB — PREGNANCY, URINE: Preg Test, Ur: NEGATIVE

## 2020-09-16 LAB — LIPASE, BLOOD: Lipase: 26 U/L (ref 11–51)

## 2020-09-16 MED ORDER — DICYCLOMINE HCL 10 MG PO CAPS
20.0000 mg | ORAL_CAPSULE | Freq: Once | ORAL | Status: AC
  Administered 2020-09-16: 20 mg via ORAL
  Filled 2020-09-16: qty 2

## 2020-09-16 NOTE — ED Triage Notes (Signed)
Patient complaining of lower right quadrant abdominal pain x 1 week radiating to back.

## 2020-09-16 NOTE — Discharge Instructions (Addendum)
I somewhat suspect that you have a ovarian cyst given your pain the timeline and location.  Please take ibuprofen and Tylenol as discussed below.  Follow-up with your OB/GYN if you do not have an OB/GYN please call the [facility] which I have given you the information for.  Please use Tylenol or ibuprofen for pain.  You may use 600 mg ibuprofen every 6 hours or 1000 mg of Tylenol every 6 hours.  You may choose to alternate between the 2.  This would be most effective.  Not to exceed 4 g of Tylenol within 24 hours.  Not to exceed 3200 mg ibuprofen 24 hours.

## 2020-09-16 NOTE — ED Provider Notes (Signed)
[facility] EMERGENCY DEPARTMENT Provider Note   CSN: [id number] Arrival date & time: 09/16/20  1013     History Chief Complaint  Patient presents with   Abdominal Pain    Santiana Glidden is a 30 y.o. female.  HPI Patient is a 30 year old female with past medical history significant for gonorrhea chlamydia trichomonas, anemia  Patient is presented today with some right lower/suprapubic abdominal pain she states has been ongoing for 5 to 7 days.  She denies any vomiting or diarrhea but does endorse some occasional episodes of nausea but states that they are mild.  Denies any chest pain shortness of breath lightheadedness or dizziness.  Does endorse some grayish vaginal discharge that she states is not abnormal for her she has a history of bacterial vaginosis and wonders if this is the same.  She does have a history of STIs.  Will perform pelvic.  Patient denies any dyspareunia however.  Denies any upper quadrant abd pain denies any history abdominal surgeries.  She continues to socially constant neither waxing or waning are not significantly worsening.  She took some Tylenol prior to arrival and states that this improved her pain from 10/10-8/10.    Past Medical History:  Diagnosis Date   Allergy    Anemia    Chlamydia    Gonorrhea    Trichomoniasis     Patient Active Problem List   Diagnosis Date Noted   Spontaneous vaginal delivery 03/01/2015   Active labor 02/28/2015   Near syncope 08/07/2014   Dehydration, mild 08/07/2014   Nausea and vomiting during pregnancy prior to [redacted] weeks gestation 08/07/2014   Gonorrhea affecting pregnancy in first trimester 08/07/2014    Past Surgical History:  Procedure Laterality Date   NO PAST SURGERIES       OB History     Gravida  2   Para  1   Term  1   Preterm  0   AB  1   Living  1      SAB  0   IAB  1   Ectopic  0   Multiple  0   Live Births  1           Family History  Problem Relation  Age of Onset   Diabetes Other    Alcohol abuse Neg Hx    Asthma Neg Hx    Arthritis Neg Hx    Birth defects Neg Hx    Cancer Neg Hx    COPD Neg Hx    Depression Neg Hx    Drug abuse Neg Hx    Early death Neg Hx    Hearing loss Neg Hx    Heart disease Neg Hx    Hyperlipidemia Neg Hx    Hypertension Neg Hx    Kidney disease Neg Hx    Learning disabilities Neg Hx    Mental illness Neg Hx    Mental retardation Neg Hx    Miscarriages / Stillbirths Neg Hx    Stroke Neg Hx    Varicose Veins Neg Hx    Vision loss Neg Hx     Social History   Tobacco Use   Smoking status: Former    Packs/day: 0.50    Years: 4.00    Pack years: 2.00    Types: E-cigarettes, Cigarettes    Quit date: 03/26/2018    Years since quitting: 2.4   Smokeless tobacco: Never  Vaping Use   Vaping Use: Former  Substance Use  Topics   Alcohol use: Not Currently    Comment: social   Drug use: Not Currently    Comment: 15 years    Home Medications Prior to Admission medications   Medication Sig Start Date End Date Taking? Authorizing Provider  doxycycline (VIBRAMYCIN) 100 MG capsule Take 1 capsule (100 mg total) by mouth 2 (two) times daily. 08/04/20   Renne Crigler, PA-C  fluconazole (DIFLUCAN) 150 MG tablet Take 1 tablet (150 mg total) by mouth if yeast infection develops. You may repeat dose in 72 hours if yeast infection persists or is not completely resolved. 08/04/20   Renne Crigler, PA-C  norgestimate-ethinyl estradiol (SPRINTEC 28) 0.25-35 MG-MCG tablet Take 1 tablet by mouth daily. 09/16/16   Mesner, Barbara Cower, MD  promethazine (PHENERGAN) 25 MG tablet TAKE 1 TABLET BY MOUTH EVERY 6 HOURS AS NEEDED FOR NAUSEA AND/OR VOMITING 03/28/20 03/28/21  Horton, Mayer Masker, MD    Allergies    A & d [dimethicone-zinc oxide-vit a-d], Fish allergy, and Latex  Review of Systems   Review of Systems  Constitutional:  Negative for chills and fever.  HENT:  Negative for congestion.   Eyes:  Negative for pain.   Respiratory:  Negative for cough and shortness of breath.   Cardiovascular:  Negative for chest pain and leg swelling.  Gastrointestinal:  Positive for abdominal pain and nausea. Negative for diarrhea and vomiting.  Genitourinary:  Negative for dysuria.  Musculoskeletal:  Negative for myalgias.  Skin:  Negative for rash.  Neurological:  Negative for dizziness and headaches.   Physical Exam Updated Vital Signs BP 116/80   Pulse (!) 50   Temp 98 F (36.7 C) (Oral)   Resp 16   Ht 5\' 9"  (1.753 m)   Wt 97.5 kg   LMP 08/18/2020 (Exact Date)   SpO2 100%   BMI 31.75 kg/m   Physical Exam Vitals and nursing note reviewed.  Constitutional:      General: She is not in acute distress.    Comments: Pleasant well-appearing 30 year old.  In no acute distress.  Sitting comfortably in bed.  Able answer questions appropriately follow commands. No increased work of breathing. Speaking in full sentences.   HENT:     Head: Normocephalic and atraumatic.     Nose: Nose normal.  Eyes:     General: No scleral icterus. Cardiovascular:     Rate and Rhythm: Normal rate and regular rhythm.     Pulses: Normal pulses.     Heart sounds: Normal heart sounds.  Pulmonary:     Effort: Pulmonary effort is normal. No respiratory distress.     Breath sounds: No wheezing.  Abdominal:     Palpations: Abdomen is soft.     Tenderness: There is abdominal tenderness.     Comments: Some mild tenderness to palpation across lower abdomen no guarding or rebound. Abdomen is soft.  No bruising.  No CVA tenderness.  Genitourinary:    Comments: Some mild adnexal tenderness Musculoskeletal:     Cervical back: Normal range of motion.     Right lower leg: No edema.     Left lower leg: No edema.  Skin:    General: Skin is warm and dry.     Capillary Refill: Capillary refill takes less than 2 seconds.  Neurological:     Mental Status: She is alert. Mental status is at baseline.  Psychiatric:        Mood and Affect:  Mood normal.        Behavior:  Behavior normal.    ED Results / Procedures / Treatments   Labs (all labs ordered are listed, but only abnormal results are displayed) Labs Reviewed  WET PREP, GENITAL - Abnormal; Notable for the following components:      Result Value   WBC, Wet Prep HPF POC MANY (*)    All other components within normal limits  COMPREHENSIVE METABOLIC PANEL - Abnormal; Notable for the following components:   Calcium 8.7 (*)    Total Bilirubin 1.7 (*)    All other components within normal limits  URINALYSIS, ROUTINE W REFLEX MICROSCOPIC - Abnormal; Notable for the following components:   APPearance CLOUDY (*)    Hgb urine dipstick MODERATE (*)    All other components within normal limits  URINALYSIS, MICROSCOPIC (REFLEX) - Abnormal; Notable for the following components:   Bacteria, UA RARE (*)    All other components within normal limits  CBC WITH DIFFERENTIAL/PLATELET  LIPASE, BLOOD  PREGNANCY, URINE  GC/CHLAMYDIA PROBE AMP ([place]) NOT AT [facility]    EKG None  Radiology No results found.  Procedures Procedures   Medications Ordered in ED Medications  dicyclomine (BENTYL) capsule 20 mg (20 mg Oral Given 09/16/20 1116)    ED Course  I have reviewed the triage vital signs and the nursing notes.  Pertinent labs & imaging results that were available during my care of the patient were reviewed by me and considered in my medical decision making (see chart for details).  Patient is well-appearing 30 year old female presented today for 1 week of abdominal pain  She is also requesting pelvic exam and will be tested for chlamydia.  Overall relatively unremarkable physical exam does have some lower abdominal tenderness to palpation.    Clinical Course as of 09/16/20 1501  Fri Sep 16, 2020  1126 Pelvic exam is unremarkable apart from some mild right adnexal tenderness. [WF]  1127 CBC without leukocytosis or anemia. [WF]  1127 CMP unremarkable. [WF]   1127 Urinalysis with squamous epithelium present likely contaminated however does not appear infected regardless.  No urinary symptoms. [WF]    Clinical Course User Index [WF] Gailen Shelter, [place]   Patient symptoms completely improved after Bentyl.  Will discharge home with Tylenol ibuprofen recommendations.   MDM Rules/Calculators/A&P                          Given that she does have some right adnexal tenderness I do have some suspicion for ovarian cyst.  Pregnancy test was negative making ectopic menses unlikely and symptoms been ongoing for 1 week.  Final Clinical Impression(s) / ED Diagnoses Final diagnoses:  Right lower quadrant abdominal pain    Rx / DC Orders ED Discharge Orders     None        Gailen Shelter, [place] 09/16/20 1501    Melene Plan, DO 09/17/20 [phone]

## 2020-09-19 LAB — GC/CHLAMYDIA PROBE AMP (~~LOC~~) NOT AT ARMC
Chlamydia: NEGATIVE
Comment: NEGATIVE
Comment: NORMAL
Neisseria Gonorrhea: NEGATIVE

## 2021-01-28 IMAGING — US US OB < 14 WEEKS - US OB TV
1 series · 14 of 28 positions shown · non-contrast
Comparison: None.

CLINICAL DATA: Pelvic pain

EXAM:
OBSTETRIC <14 WK US AND TRANSVAGINAL OB US
TECHNIQUE: Both transabdominal and transvaginal ultrasound examinations were
performed for complete evaluation of the gestation as well as the
maternal uterus, adnexal regions, and pelvic cul-de-sac.
Transvaginal technique was performed to assess early pregnancy.

[Series 1: us ob < 14 weeks - us ob tv · 45 acquisitions, 14 frames shown]
[im 2/45]
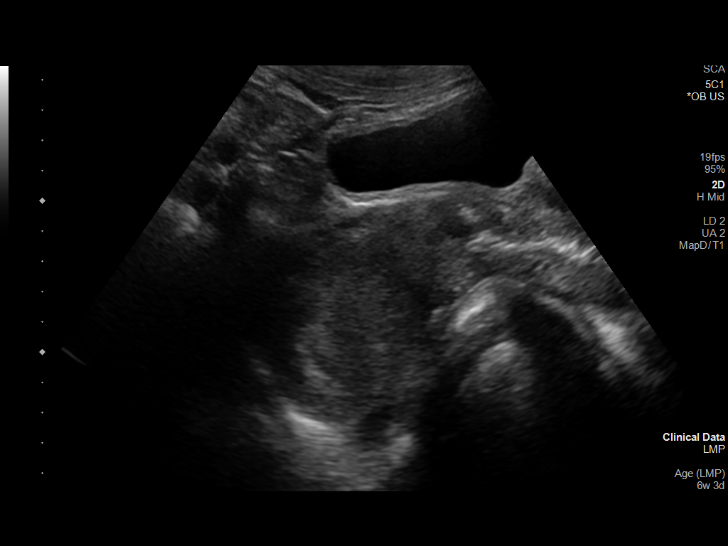
[im 5/45]
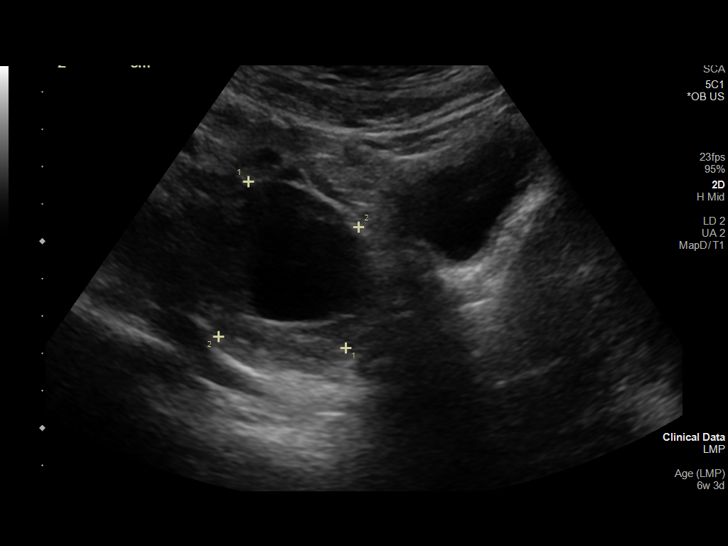
[im 9/45]
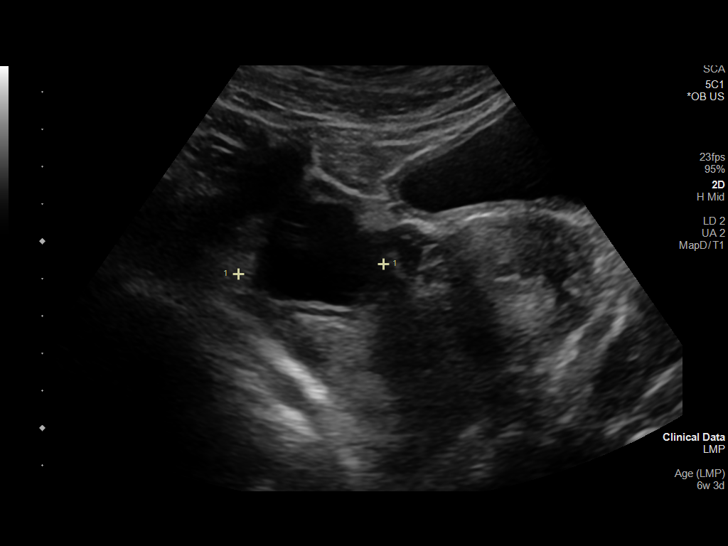
[im 12/45]
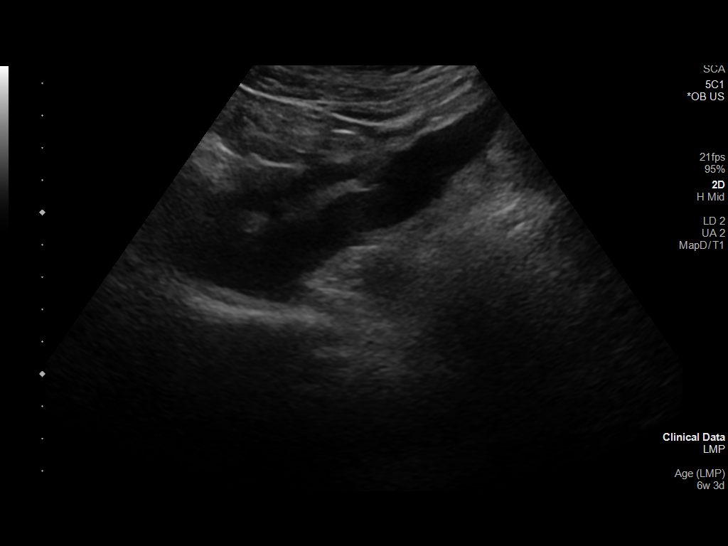
[im 15/45]
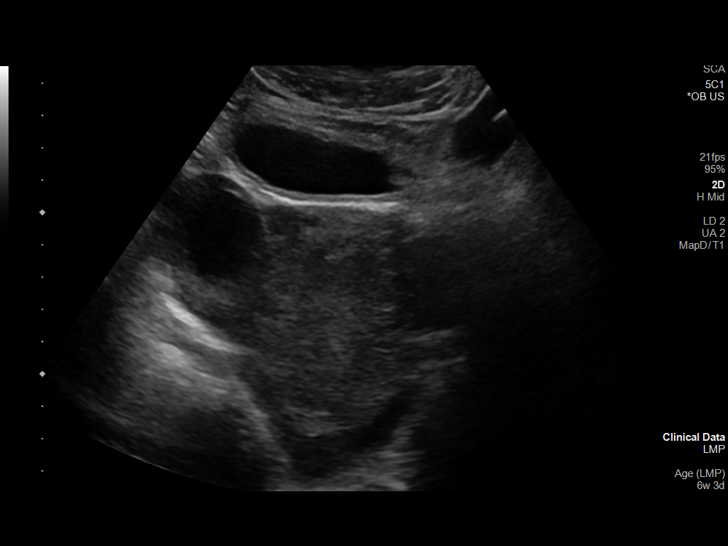
[im 18/45]
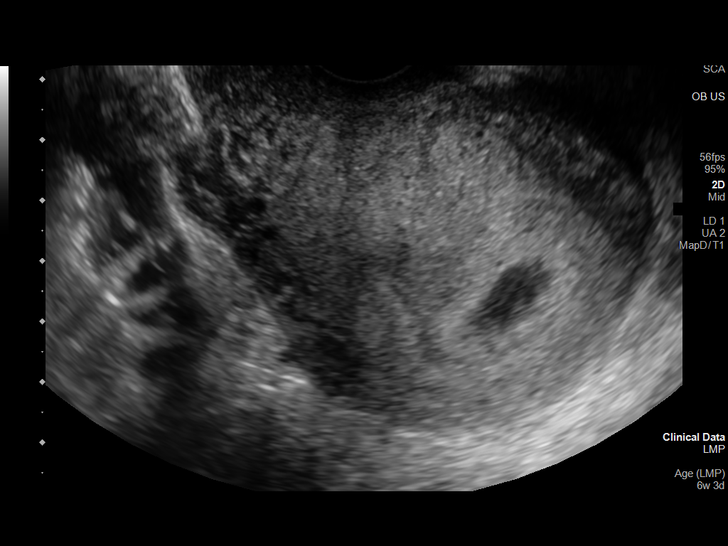
[im 22/45]
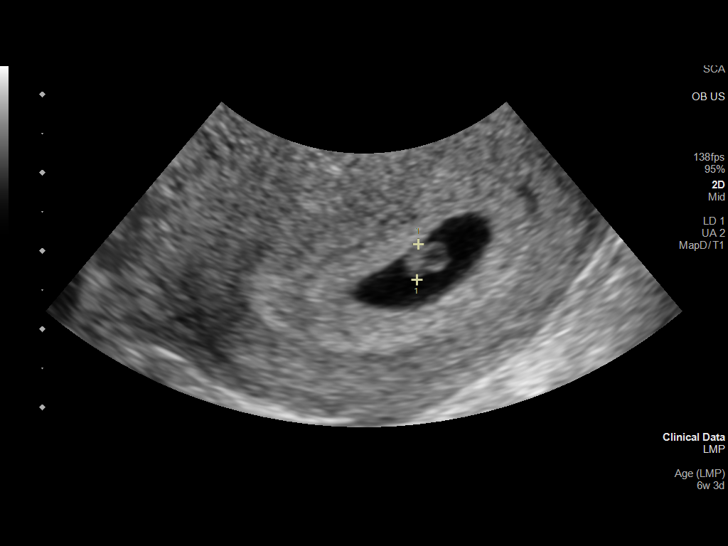
[im 25/45]
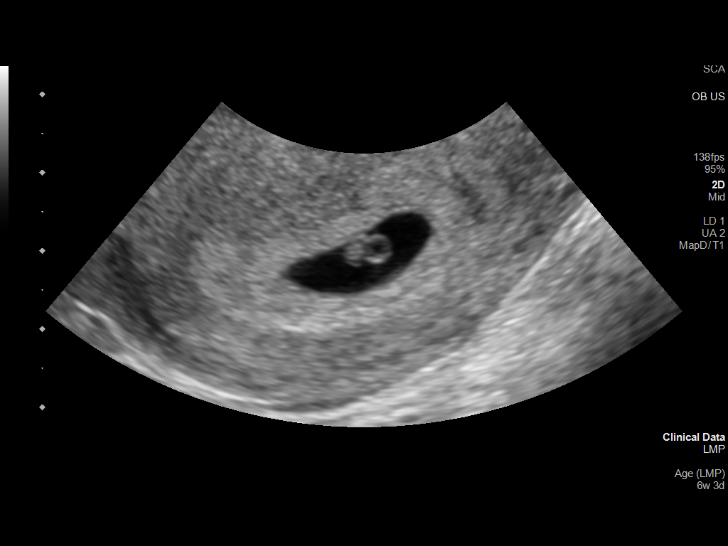
[im 28/45]
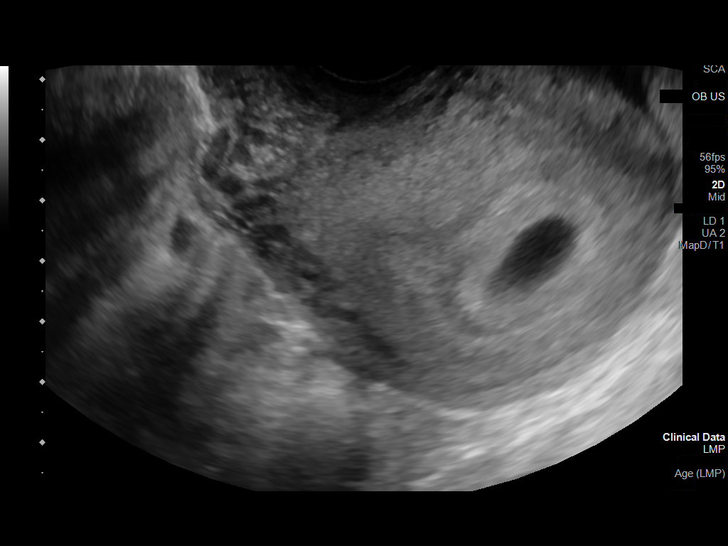
[im 31/45]
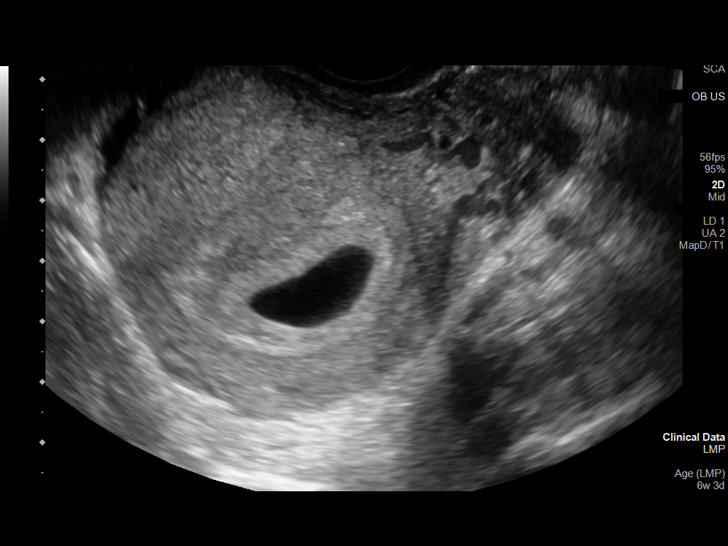
[im 35/45]
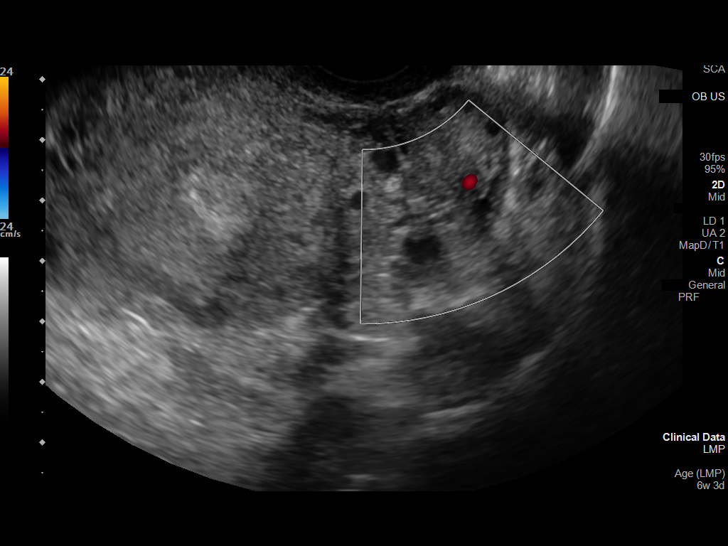
[im 38/45]
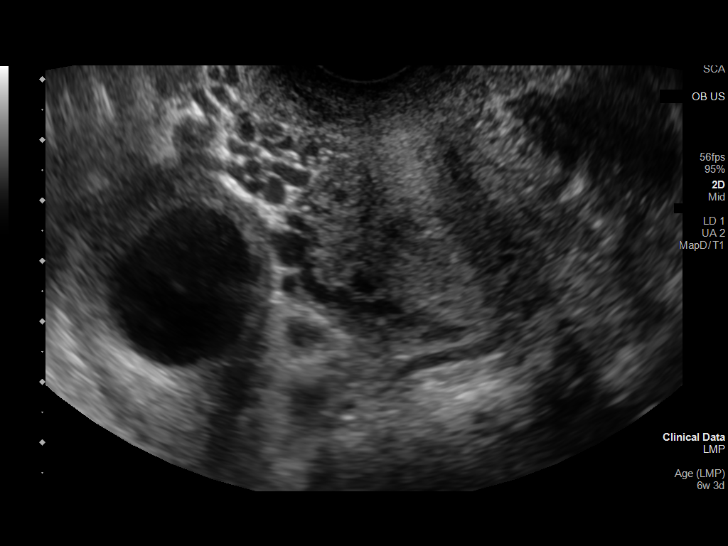
[im 41/45]
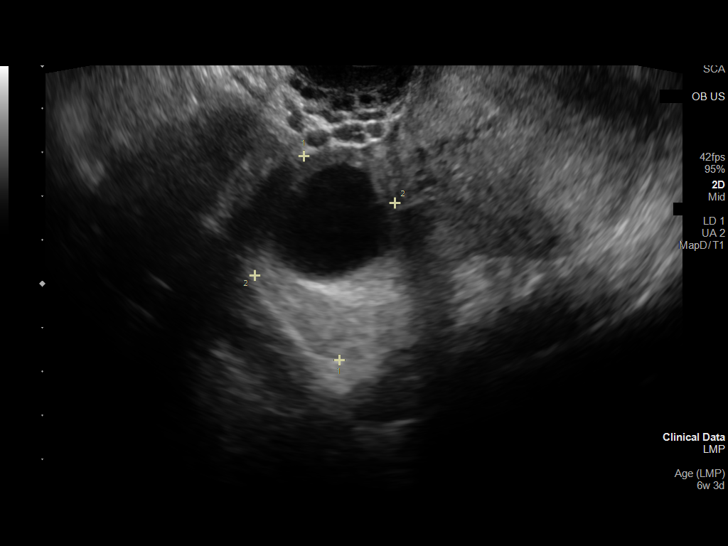
[im 45/45]
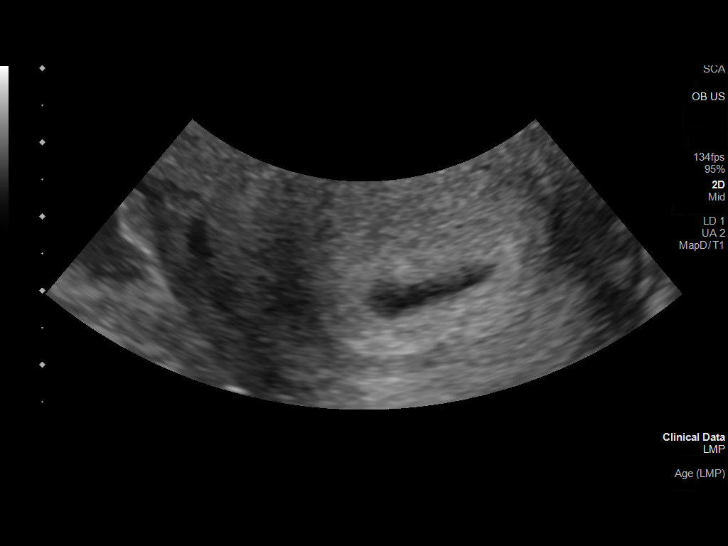

[14 of 28 positions shown; findings below may reference images not displayed]

FINDINGS: Intrauterine gestational sac: Single

Yolk sac:  Visualized.

Embryo:  Visualized.

Cardiac Activity: Visualized.

Heart Rate: 113 bpm

CRL: 3.9 mm   6 w   0 d                  US EDC: 11/10/2020

Subchorionic hemorrhage:  None visualized.

Maternal uterus/adnexae: Right ovary measures 5.2 x 4.8 by 3.9 cm,
with a 3.4 cm cyst likely representing a corpus luteum cyst. Left
ovary is not well seen. There is trace free fluid in the pelvis.
IMPRESSION: 1. Single live intrauterine pregnancy as above, estimated age 6
weeks and 0 days.
2. Corpus luteum cyst within the right adnexa.

## 2021-05-03 ENCOUNTER — Other Ambulatory Visit (HOSPITAL_BASED_OUTPATIENT_CLINIC_OR_DEPARTMENT_OTHER): Payer: Self-pay

## 2021-05-03 ENCOUNTER — Emergency Department (HOSPITAL_BASED_OUTPATIENT_CLINIC_OR_DEPARTMENT_OTHER)
Admission: EM | Admit: 2021-05-03 | Discharge: 2021-05-03 | Disposition: A | Discharge status: Home / Self Care | Payer: Medicaid Other | Attending: Emergency Medicine | Admitting: Emergency Medicine

## 2021-05-03 ENCOUNTER — Encounter (HOSPITAL_BASED_OUTPATIENT_CLINIC_OR_DEPARTMENT_OTHER): Payer: Self-pay

## 2021-05-03 ENCOUNTER — Other Ambulatory Visit: Payer: Self-pay

## 2021-05-03 DIAGNOSIS — R102 Pelvic and perineal pain: Secondary | ICD-10-CM | POA: Diagnosis present

## 2021-05-03 DIAGNOSIS — N898 Other specified noninflammatory disorders of vagina: Secondary | ICD-10-CM | POA: Diagnosis not present

## 2021-05-03 DIAGNOSIS — Z91040 Latex allergy status: Secondary | ICD-10-CM | POA: Insufficient documentation

## 2021-05-03 LAB — URINALYSIS, ROUTINE W REFLEX MICROSCOPIC
Glucose, UA: NEGATIVE mg/dL
Hgb urine dipstick: NEGATIVE
Ketones, ur: NEGATIVE mg/dL
Leukocytes,Ua: NEGATIVE
Nitrite: NEGATIVE
Protein, ur: 30 mg/dL — AB
Specific Gravity, Urine: 1.025 (ref 1.005–1.030)
pH: 7 (ref 5.0–8.0)

## 2021-05-03 LAB — URINALYSIS, MICROSCOPIC (REFLEX)

## 2021-05-03 LAB — COMPREHENSIVE METABOLIC PANEL
ALT: 18 U/L (ref 0–44)
AST: 16 U/L (ref 15–41)
Albumin: 3.4 g/dL — ABNORMAL LOW (ref 3.5–5.0)
Alkaline Phosphatase: 53 U/L (ref 38–126)
Anion gap: 6 (ref 5–15)
BUN: 9 mg/dL (ref 6–20)
CO2: 26 mmol/L (ref 22–32)
Calcium: 8.5 mg/dL — ABNORMAL LOW (ref 8.9–10.3)
Chloride: 106 mmol/L (ref 98–111)
Creatinine, Ser: 0.61 mg/dL (ref 0.44–1.00)
GFR, Estimated: >60 mL/min (ref >60)
Glucose, Bld: 94 mg/dL (ref 70–99)
Potassium: 3.6 mmol/L (ref 3.5–5.1)
Sodium: 138 mmol/L (ref 135–145)
Total Bilirubin: 1.1 mg/dL (ref 0.3–1.2)
Total Protein: 6.5 g/dL (ref 6.5–8.1)

## 2021-05-03 LAB — CBC
HCT: 39.1 % (ref 36.0–46.0)
Hemoglobin: 12.3 g/dL (ref 12.0–15.0)
MCH: 27.5 pg (ref 26.0–34.0)
MCHC: 31.5 g/dL (ref 30.0–36.0)
MCV: 87.3 fL (ref 80.0–100.0)
Platelets: 203 10*3/uL (ref 150–400)
RBC: 4.48 MIL/uL (ref 3.87–5.11)
RDW: 12.7 % (ref 11.5–15.5)
WBC: 4.8 10*3/uL (ref 4.0–10.5)
nRBC: 0 % (ref 0.0–0.2)

## 2021-05-03 LAB — PREGNANCY, URINE: Preg Test, Ur: NEGATIVE

## 2021-05-03 LAB — LIPASE, BLOOD: Lipase: 28 U/L (ref 11–51)

## 2021-05-03 MED ORDER — DOXYCYCLINE HYCLATE 100 MG PO CAPS
100.0000 mg | ORAL_CAPSULE | Freq: Two times a day (BID) | ORAL | 0 refills | Status: AC
  Filled 2021-05-03: qty 20, 10d supply, fill #0

## 2021-05-03 MED ORDER — SODIUM CHLORIDE 0.9 % IV SOLN
1.0000 g | Freq: Once | INTRAVENOUS | Status: AC
  Administered 2021-05-03: 1 g via INTRAVENOUS
  Filled 2021-05-03: qty 10

## 2021-05-03 MED ORDER — FLUCONAZOLE 150 MG PO TABS
150.0000 mg | ORAL_TABLET | Freq: Every day | ORAL | 0 refills | Status: AC
  Filled 2021-05-03: qty 2, 2d supply, fill #0

## 2021-05-03 NOTE — ED Triage Notes (Signed)
Pt c/o abd cramps x 1 week-NAD-steady gait

## 2021-05-03 NOTE — ED Notes (Signed)
Pt will be discharged after rocephin complete

## 2021-05-03 NOTE — ED Notes (Signed)
Pt describes grayish discharge + foul odor

## 2021-05-03 NOTE — ED Provider Notes (Signed)
[facility] EMERGENCY DEPARTMENT Provider Note   CSN: [id number] Arrival date & time: 05/03/21  1309     History  Chief Complaint  Patient presents with   Abdominal Pain    Alexandra Nguyen is a 31 y.o. female.  Patient presents to the emergency department for evaluation of lower abdominal pelvic cramping.  Patient reports that symptoms have been ongoing for approximately 1 week.  Symptoms are intermittent.  She reports that it feels like menstrual cramps, but she has not currently having her period.  Patient reports that there is some change in the pain with different positions.  She has not had any urinary symptoms.  She has had a cloudy vaginal discharge.  Patient reports that the symptoms are similar to when she has had an STD in the past.      Home Medications Prior to Admission medications   Medication Sig Start Date End Date Taking? Authorizing Provider  doxycycline (VIBRAMYCIN) 100 MG capsule Take 1 capsule (100 mg total) by mouth 2 (two) times daily. 05/03/21  Yes Zriyah Kopplin, Canary Brim, MD  fluconazole (DIFLUCAN) 150 MG tablet Take 1 tablet (150 mg total) by mouth if yeast infection develops. You may repeat dose in 72 hours if yeast infection persists or is not completely resolved. 05/03/21   Gilda Crease, MD  norgestimate-ethinyl estradiol (SPRINTEC 28) 0.25-35 MG-MCG tablet Take 1 tablet by mouth daily. 09/16/16   Mesner, Barbara Cower, MD  promethazine (PHENERGAN) 25 MG tablet TAKE 1 TABLET BY MOUTH EVERY 6 HOURS AS NEEDED FOR NAUSEA AND/OR VOMITING 03/28/20 03/28/21  Horton, Mayer Masker, MD      Allergies    A & d [dimethicone-zinc oxide-vit a-d], Fish allergy, and Latex    Review of Systems   Review of Systems  Genitourinary:  Positive for pelvic pain and vaginal discharge.   Physical Exam Updated Vital Signs BP 118/82    Pulse (!) 50    Temp 97.8 F (36.6 C) (Oral)    Resp 18    Ht 5\' 8"  (1.727 m)    Wt 109.8 kg    LMP 04/24/2021 (Approximate)    SpO2 100%     BMI 36.80 kg/m  Physical Exam Vitals and nursing note reviewed.  Constitutional:      General: She is not in acute distress.    Appearance: She is well-developed.  HENT:     Head: Normocephalic and atraumatic.  Eyes:     Conjunctiva/sclera: Conjunctivae normal.  Cardiovascular:     Rate and Rhythm: Normal rate and regular rhythm.     Heart sounds: No murmur heard. Pulmonary:     Effort: Pulmonary effort is normal. No respiratory distress.     Breath sounds: Normal breath sounds.  Abdominal:     Palpations: Abdomen is soft.     Tenderness: There is no abdominal tenderness.  Musculoskeletal:        General: No swelling.     Cervical back: Neck supple.  Skin:    General: Skin is warm and dry.     Capillary Refill: Capillary refill takes less than 2 seconds.  Neurological:     Mental Status: She is alert.  Psychiatric:        Mood and Affect: Mood normal.    ED Results / Procedures / Treatments   Labs (all labs ordered are listed, but only abnormal results are displayed) Labs Reviewed  URINALYSIS, ROUTINE W REFLEX MICROSCOPIC - Abnormal; Notable for the following components:  Result Value   APPearance HAZY (*)    Bilirubin Urine SMALL (*)    Protein, ur 30 (*)    All other components within normal limits  URINALYSIS, MICROSCOPIC (REFLEX) - Abnormal; Notable for the following components:   Bacteria, UA RARE (*)    All other components within normal limits  CBC  PREGNANCY, URINE  COMPREHENSIVE METABOLIC PANEL  LIPASE, BLOOD  GC/CHLAMYDIA PROBE AMP ([place]) NOT AT [facility]    EKG None  Radiology No results found.  Procedures Procedures    Medications Ordered in ED Medications  cefTRIAXone (ROCEPHIN) 1 g in sodium chloride 0.9 % 100 mL IVPB (has no administration in time range)    ED Course/ Medical Decision Making/ A&P                           Medical Decision Making Amount and/or Complexity of Data Reviewed Labs: ordered.   31 year old female  presents with lower abdominal and pelvic discomfort.  Symptoms intermittent for 1 week.  No associated nausea, vomiting, diarrhea.  She does have vaginal discharge and reports that this feels similar to when she has had gonorrhea.  Examination is benign.  Patient with some very slight suprapubic discomfort with palpation, no right lower quadrant tenderness, no left lower quadrant tenderness.  Upper abdominal exam is benign.  Exam does not support concern for cholecystitis, appendicitis.  Patient is not uncomfortable, does not appear to be having any significant pain currently.  Ovarian torsion felt to be unlikely.  Patient appears well.  Lab work is normal.  Normal white blood cell count.  Urinalysis without signs of infection.  She is afebrile.  Vital signs are unremarkable.  Patient reports that this feels similar to previous STD.  Will test for GC, chlamydia, treat empirically.        Final Clinical Impression(s) / ED Diagnoses Final diagnoses:  Pelvic pain in female    Rx / DC Orders ED Discharge Orders          Ordered    doxycycline (VIBRAMYCIN) 100 MG capsule  2 times daily        05/03/21 1641    fluconazole (DIFLUCAN) 150 MG tablet  Daily        05/03/21 1641              Gilda Crease, MD 05/03/21 1641

## 2021-05-04 LAB — GC/CHLAMYDIA PROBE AMP (~~LOC~~) NOT AT ARMC
Chlamydia: POSITIVE — AB
Comment: NEGATIVE
Comment: NORMAL
Neisseria Gonorrhea: NEGATIVE

## 2021-08-09 ENCOUNTER — Emergency Department (HOSPITAL_BASED_OUTPATIENT_CLINIC_OR_DEPARTMENT_OTHER)
Admission: EM | Admit: 2021-08-09 | Discharge: 2021-08-09 | Disposition: A | Discharge status: Home / Self Care | Payer: Medicaid Other | Attending: Emergency Medicine | Admitting: Emergency Medicine

## 2021-08-09 ENCOUNTER — Other Ambulatory Visit: Payer: Self-pay

## 2021-08-09 ENCOUNTER — Encounter (HOSPITAL_BASED_OUTPATIENT_CLINIC_OR_DEPARTMENT_OTHER): Payer: Self-pay | Admitting: Emergency Medicine

## 2021-08-09 ENCOUNTER — Other Ambulatory Visit (HOSPITAL_BASED_OUTPATIENT_CLINIC_OR_DEPARTMENT_OTHER): Payer: Self-pay

## 2021-08-09 DIAGNOSIS — N760 Acute vaginitis: Secondary | ICD-10-CM | POA: Insufficient documentation

## 2021-08-09 DIAGNOSIS — Z91040 Latex allergy status: Secondary | ICD-10-CM | POA: Insufficient documentation

## 2021-08-09 DIAGNOSIS — B9689 Other specified bacterial agents as the cause of diseases classified elsewhere: Secondary | ICD-10-CM

## 2021-08-09 DIAGNOSIS — N898 Other specified noninflammatory disorders of vagina: Secondary | ICD-10-CM | POA: Diagnosis present

## 2021-08-09 LAB — URINALYSIS, ROUTINE W REFLEX MICROSCOPIC
Bilirubin Urine: NEGATIVE
Glucose, UA: NEGATIVE mg/dL
Ketones, ur: NEGATIVE mg/dL
Leukocytes,Ua: NEGATIVE
Nitrite: NEGATIVE
Protein, ur: NEGATIVE mg/dL
Specific Gravity, Urine: 1.025 (ref 1.005–1.030)
pH: 7 (ref 5.0–8.0)

## 2021-08-09 LAB — URINALYSIS, MICROSCOPIC (REFLEX)

## 2021-08-09 LAB — WET PREP, GENITAL
Sperm: NONE SEEN
Trich, Wet Prep: NONE SEEN
WBC, Wet Prep HPF POC: <10 (ref <10)
Yeast Wet Prep HPF POC: NONE SEEN

## 2021-08-09 LAB — PREGNANCY, URINE: Preg Test, Ur: NEGATIVE

## 2021-08-09 MED ORDER — METRONIDAZOLE 500 MG PO TABS
500.0000 mg | ORAL_TABLET | Freq: Two times a day (BID) | ORAL | 0 refills | Status: AC
  Filled 2021-08-09: qty 14, 7d supply, fill #0

## 2021-08-09 NOTE — ED Notes (Signed)
Vag d/c with an bad  odor x 3 days,  has had unprotected intercourse she states  , no itching  ?

## 2021-08-09 NOTE — ED Triage Notes (Signed)
Pt via pov from home with pelvic pain x 3 days, as well as vaginal discharge. Pt reports hx of STI and states this feels the same. Pt alert & oriented, nad noted. ?

## 2021-08-09 NOTE — Discharge Instructions (Signed)
You have bacterial vaginosis.  STD testing thus far is negative.  Follow-up your chlamydia and gonorrhea testing on your MyChart.  Take antibiotic for bacterial vaginosis. ?

## 2021-08-09 NOTE — ED Provider Notes (Signed)
?[facility] EMERGENCY DEPARTMENT ?Provider Note ? ? ?CSN: [id number] ?Arrival date & time: 08/09/21  2694 ? ?  ? ?History ? ?Chief Complaint  ?Patient presents with  ? Vaginal Discharge  ? ? ?Alexandra Nguyen is a 31 y.o. female. ? ?Patient here with vaginal discharge.  Symptoms for the last several days.  Feels like she has an STD.  Not having any severe pain or fever or chills.  Nothing makes it worse or better.  History of STDs and yeast infection in the past.  Denies any urinary symptoms.  Denies any current abdominal pain. ? ? ? ?  ? ?Home Medications ?Prior to Admission medications   ?Medication Sig Start Date End Date Taking? Authorizing Provider  ?metroNIDAZOLE (FLAGYL) 500 MG tablet Take 1 tablet (500 mg total) by mouth 2 (two) times daily for 7 days. 08/09/21 08/16/21 Yes Seyed Heffley, DO  ?doxycycline (VIBRAMYCIN) 100 MG capsule Take 1 capsule (100 mg total) by mouth 2 (two) times daily. 05/03/21   Gilda Crease, MD  ?fluconazole (DIFLUCAN) 150 MG tablet Take 1 tablet (150 mg total) by mouth if yeast infection develops. You may repeat dose in 72 hours if yeast infection persists or is not completely resolved. 05/03/21   Gilda Crease, MD  ?norgestimate-ethinyl estradiol (SPRINTEC 28) 0.25-35 MG-MCG tablet Take 1 tablet by mouth daily. 09/16/16   Mesner, Barbara Cower, MD  ?promethazine (PHENERGAN) 25 MG tablet TAKE 1 TABLET BY MOUTH EVERY 6 HOURS AS NEEDED FOR NAUSEA AND/OR VOMITING 03/28/20 03/28/21  Horton, Mayer Masker, MD  ?   ? ?Allergies    ?A & d [dimethicone-zinc oxide-vit a-d], Fish allergy, and Latex   ? ?Review of Systems   ?Review of Systems ? ?Physical Exam ?Updated Vital Signs ?BP 124/88 (BP Location: Right Arm)   Pulse 74   Temp 98.1 ?F (36.7 ?C) (Oral)   Resp 17   Ht 5\' 9"  (1.753 m)   Wt 109.8 kg   LMP 07/21/2021 (Exact Date)   SpO2 100%   BMI 35.75 kg/m?  ?Physical Exam ?Vitals and nursing note reviewed. Exam conducted with a chaperone present.  ?Constitutional:   ?    General: She is not in acute distress. ?   Appearance: She is well-developed.  ?HENT:  ?   Head: Normocephalic and atraumatic.  ?Eyes:  ?   Conjunctiva/sclera: Conjunctivae normal.  ?Cardiovascular:  ?   Rate and Rhythm: Normal rate and regular rhythm.  ?   Heart sounds: No murmur heard. ?Pulmonary:  ?   Effort: Pulmonary effort is normal. No respiratory distress.  ?   Breath sounds: Normal breath sounds.  ?Abdominal:  ?   Palpations: Abdomen is soft.  ?   Tenderness: There is no abdominal tenderness. There is no guarding or rebound.  ?Genitourinary: ?   Comments: Thin vaginal discharge that is clear and white.  No cervical motion tenderness ?Musculoskeletal:     ?   General: No swelling.  ?   Cervical back: Neck supple.  ?Skin: ?   General: Skin is warm and dry.  ?   Capillary Refill: Capillary refill takes less than 2 seconds.  ?Neurological:  ?   Mental Status: She is alert.  ?Psychiatric:     ?   Mood and Affect: Mood normal.  ? ? ?ED Results / Procedures / Treatments   ?Labs ?(all labs ordered are listed, but only abnormal results are displayed) ?Labs Reviewed  ?WET PREP, GENITAL - Abnormal; Notable for the following components:  ?  Result Value  ? Clue Cells Wet Prep HPF POC PRESENT (*)   ? All other components within normal limits  ?URINALYSIS, ROUTINE W REFLEX MICROSCOPIC - Abnormal; Notable for the following components:  ? APPearance HAZY (*)   ? Hgb urine dipstick TRACE (*)   ? All other components within normal limits  ?URINALYSIS, MICROSCOPIC (REFLEX) - Abnormal; Notable for the following components:  ? Bacteria, UA RARE (*)   ? All other components within normal limits  ?PREGNANCY, URINE  ?GC/CHLAMYDIA PROBE AMP ([place]) NOT AT [facility]  ? ? ?EKG ?None ? ?Radiology ?No results found. ? ?Procedures ?Procedures  ? ? ?Medications Ordered in ED ?Medications - No data to display ? ?ED Course/ Medical Decision Making/ A&P ?  ?                        ?Medical Decision Making ?Amount and/or Complexity of  Data Reviewed ?Labs: ordered. ? ?Risk ?Prescription drug management. ? ? ?Alexandra Nguyen is here with vaginal discharge.  Normal vitals.  No fever.  No abdominal tenderness.  Very well-appearing.  She is concerned for STDs.  History of the same.  She will self swab for gonorrhea, chlamydia, BV, yeast.  Will check urinalysis and pregnancy test.  I have no concern for PID or other acute intra-abdominal infectious process. ? ?Positive for bacterial vaginosis.  Negative pregnancy test.  No UTI.  Prescribed Flagyl.  Discharged in good condition. ? ?This chart was dictated using voice recognition software.  Despite best efforts to proofread,  errors can occur which can change the documentation meaning.  ? ? ? ? ? ? ? ?Final Clinical Impression(s) / ED Diagnoses ?Final diagnoses:  ?BV (bacterial vaginosis)  ? ? ?Rx / DC Orders ?ED Discharge Orders   ? ?      Ordered  ?  metroNIDAZOLE (FLAGYL) 500 MG tablet  2 times daily       ? 08/09/21 1134  ? ?  ?  ? ?  ? ? ?  ?Virgina Norfolk, DO ?08/09/21 1135 ? ?

## 2021-08-10 LAB — GC/CHLAMYDIA PROBE AMP (~~LOC~~) NOT AT ARMC
Chlamydia: NEGATIVE
Comment: NEGATIVE
Comment: NORMAL
Neisseria Gonorrhea: NEGATIVE

## 2022-11-21 ENCOUNTER — Other Ambulatory Visit: Payer: Self-pay

## 2022-11-21 ENCOUNTER — Emergency Department (HOSPITAL_BASED_OUTPATIENT_CLINIC_OR_DEPARTMENT_OTHER)
Admission: EM | Admit: 2022-11-21 | Discharge: 2022-11-21 | Disposition: A | Discharge status: Home / Self Care | Payer: No Typology Code available for payment source | Attending: Emergency Medicine | Admitting: Emergency Medicine

## 2022-11-21 ENCOUNTER — Encounter (HOSPITAL_BASED_OUTPATIENT_CLINIC_OR_DEPARTMENT_OTHER): Payer: Self-pay

## 2022-11-21 DIAGNOSIS — U071 COVID-19: Secondary | ICD-10-CM | POA: Insufficient documentation

## 2022-11-21 DIAGNOSIS — Z91040 Latex allergy status: Secondary | ICD-10-CM | POA: Diagnosis not present

## 2022-11-21 DIAGNOSIS — R509 Fever, unspecified: Secondary | ICD-10-CM | POA: Diagnosis present

## 2022-11-21 LAB — RESP PANEL BY RT-PCR (RSV, FLU A&B, COVID)  RVPGX2
Influenza A by PCR: NEGATIVE
Influenza B by PCR: NEGATIVE
Resp Syncytial Virus by PCR: NEGATIVE
SARS Coronavirus 2 by RT PCR: POSITIVE — AB

## 2022-11-21 MED ORDER — IBUPROFEN 800 MG PO TABS
800.0000 mg | ORAL_TABLET | Freq: Once | ORAL | Status: AC
  Administered 2022-11-21: 800 mg via ORAL
  Filled 2022-11-21: qty 1

## 2022-11-21 NOTE — ED Triage Notes (Signed)
Fever and sinus pressure Generalized body aches Started yesterday

## 2022-11-21 NOTE — ED Provider Notes (Signed)
   [place] EMERGENCY DEPARTMENT AT [facility]  Provider Note  CSN: [id number] Arrival date & time: 11/21/22 0028  History Chief Complaint  Patient presents with   Fever    Alexandra Nguyen is a 32 y.o. female with no significant PMH reports 2 days of fever, body aches, general malaise and nasal congestion.    Home Medications Prior to Admission medications   Medication Sig Start Date End Date Taking? Authorizing Provider  doxycycline (VIBRAMYCIN) 100 MG capsule Take 1 capsule (100 mg total) by mouth 2 (two) times daily. 05/03/21   Gilda Crease, MD  fluconazole (DIFLUCAN) 150 MG tablet Take 1 tablet (150 mg total) by mouth if yeast infection develops. You may repeat dose in 72 hours if yeast infection persists or is not completely resolved. 05/03/21   Gilda Crease, MD  norgestimate-ethinyl estradiol (SPRINTEC 28) 0.25-35 MG-MCG tablet Take 1 tablet by mouth daily. 09/16/16   Mesner, Barbara Cower, MD  promethazine (PHENERGAN) 25 MG tablet TAKE 1 TABLET BY MOUTH EVERY 6 HOURS AS NEEDED FOR NAUSEA AND/OR VOMITING 03/28/20 03/28/21  Horton, Mayer Masker, MD     Allergies    A & d [dimethicone-zinc oxide-vit a-d], Fish allergy, and Latex   Review of Systems   Review of Systems Please see HPI for pertinent positives and negatives  Physical Exam BP 113/80 (BP Location: Left Arm)   Pulse 87   Temp (!) 101 F (38.3 C)   Resp 18   Ht 5\' 9"  (1.753 m)   Wt 112.9 kg   LMP 10/26/2022 (Exact Date)   SpO2 100%   BMI 36.77 kg/m   Physical Exam Vitals and nursing note reviewed.  HENT:     Head: Normocephalic.     Nose: Nose normal.  Eyes:     Extraocular Movements: Extraocular movements intact.  Pulmonary:     Effort: Pulmonary effort is normal. No respiratory distress.  Musculoskeletal:        General: Normal range of motion.     Cervical back: Neck supple.  Skin:    Findings: No rash (on exposed skin).  Neurological:     Mental Status: She is alert and  oriented to person, place, and time.  Psychiatric:        Mood and Affect: Mood normal.     ED Results / Procedures / Treatments   EKG None  Procedures Procedures  Medications Ordered in the ED Medications  ibuprofen (ADVIL) tablet 800 mg (800 mg Oral Given 11/21/22 0111)    Initial Impression and Plan  Patient here with URI symptoms and fever. Covid/Flu/RSV swab is positive for Covid. Patient offered Paxlovid as she is unvaccinated but she declines. Recommend supportive care, isolation, masking and PCP follow up, RTED for any other concerns.    ED Course       MDM Rules/Calculators/A&P Medical Decision Making Problems Addressed: COVID-19: acute illness or injury  Amount and/or Complexity of Data Reviewed Labs: ordered. Decision-making details documented in ED Course.  Risk OTC drugs. Prescription drug management.     Final Clinical Impression(s) / ED Diagnoses Final diagnoses:  COVID-19    Rx / DC Orders ED Discharge Orders     None        Pollyann Savoy, MD 11/21/22 [phone]

## 2023-02-03 ENCOUNTER — Emergency Department (HOSPITAL_BASED_OUTPATIENT_CLINIC_OR_DEPARTMENT_OTHER)
Admission: EM | Admit: 2023-02-03 | Discharge: 2023-02-03 | Disposition: A | Discharge status: Home / Self Care | Payer: No Typology Code available for payment source | Attending: Emergency Medicine | Admitting: Emergency Medicine

## 2023-02-03 ENCOUNTER — Encounter (HOSPITAL_BASED_OUTPATIENT_CLINIC_OR_DEPARTMENT_OTHER): Payer: Self-pay

## 2023-02-03 DIAGNOSIS — J069 Acute upper respiratory infection, unspecified: Secondary | ICD-10-CM | POA: Insufficient documentation

## 2023-02-03 DIAGNOSIS — R059 Cough, unspecified: Secondary | ICD-10-CM | POA: Diagnosis present

## 2023-02-03 DIAGNOSIS — Z1152 Encounter for screening for COVID-19: Secondary | ICD-10-CM | POA: Insufficient documentation

## 2023-02-03 DIAGNOSIS — Z91040 Latex allergy status: Secondary | ICD-10-CM | POA: Insufficient documentation

## 2023-02-03 LAB — RESP PANEL BY RT-PCR (RSV, FLU A&B, COVID)  RVPGX2
Influenza A by PCR: NEGATIVE
Influenza B by PCR: NEGATIVE
Resp Syncytial Virus by PCR: NEGATIVE
SARS Coronavirus 2 by RT PCR: NEGATIVE

## 2023-02-03 NOTE — Discharge Instructions (Addendum)
Testing negative for RSV influenza and COVID.  Symptomatic treatment with over-the-counter cold and flu medicines.  Return for any new or worse symptoms.  Particularly if you start developing high fevers.  Work note provided.

## 2023-02-03 NOTE — ED Triage Notes (Signed)
Pt reports that she has had flu-like symptoms since Friday. Is requesting a Covid test.  Denies fever.

## 2023-02-03 NOTE — ED Provider Notes (Signed)
[place] EMERGENCY DEPARTMENT AT [facility] Provider Note   CSN: [id number] Arrival date & time: 02/03/23  4098     History  Chief Complaint  Patient presents with   URI    Laikin Longtin is a 32 y.o. female.  Patient with onset of flulike symptoms on Friday.  Patient is concerned about having COVID again.  Patient did have COVID in August.  Patient with cough congestion.  No fevers.  Oxygen saturation here is 100% on room air.  Temp 97.4.  Past medical history significant for history of allergies.       Home Medications Prior to Admission medications   Medication Sig Start Date End Date Taking? Authorizing Provider  doxycycline (VIBRAMYCIN) 100 MG capsule Take 1 capsule (100 mg total) by mouth 2 (two) times daily. 05/03/21   Gilda Crease, MD  fluconazole (DIFLUCAN) 150 MG tablet Take 1 tablet (150 mg total) by mouth if yeast infection develops. You may repeat dose in 72 hours if yeast infection persists or is not completely resolved. 05/03/21   Gilda Crease, MD  norgestimate-ethinyl estradiol (SPRINTEC 28) 0.25-35 MG-MCG tablet Take 1 tablet by mouth daily. 09/16/16   Mesner, Barbara Cower, MD  promethazine (PHENERGAN) 25 MG tablet TAKE 1 TABLET BY MOUTH EVERY 6 HOURS AS NEEDED FOR NAUSEA AND/OR VOMITING 03/28/20 03/28/21  Horton, Mayer Masker, MD      Allergies    A & d [dimethicone-zinc oxide-vit a-d], Fish allergy, and Latex    Review of Systems   Review of Systems  Constitutional:  Negative for chills and fever.  HENT:  Positive for congestion. Negative for ear pain and sore throat.   Eyes:  Negative for pain and visual disturbance.  Respiratory:  Positive for cough. Negative for shortness of breath.   Cardiovascular:  Negative for chest pain and palpitations.  Gastrointestinal:  Negative for abdominal pain and vomiting.  Genitourinary:  Negative for dysuria and hematuria.  Musculoskeletal:  Negative for arthralgias and back pain.  Skin:  Negative  for color change and rash.  Neurological:  Negative for seizures and syncope.  All other systems reviewed and are negative.   Physical Exam Updated Vital Signs BP (!) 129/92 (BP Location: Right Arm)   Pulse 91   Temp (!) 97.4 F (36.3 C) (Oral)   Resp 18   Ht 1.753 m (5\' 9" )   Wt 113.4 kg   LMP 01/06/2023 (Exact Date)   SpO2 100%   BMI 36.92 kg/m  Physical Exam Vitals and nursing note reviewed.  Constitutional:      General: She is not in acute distress.    Appearance: Normal appearance. She is well-developed.  HENT:     Head: Normocephalic and atraumatic.     Mouth/Throat:     Mouth: Mucous membranes are moist.     Pharynx: Oropharynx is clear. No oropharyngeal exudate or posterior oropharyngeal erythema.  Eyes:     Extraocular Movements: Extraocular movements intact.     Conjunctiva/sclera: Conjunctivae normal.     Pupils: Pupils are equal, round, and reactive to light.  Cardiovascular:     Rate and Rhythm: Normal rate and regular rhythm.     Heart sounds: No murmur heard. Pulmonary:     Effort: Pulmonary effort is normal. No respiratory distress.     Breath sounds: Normal breath sounds. No wheezing, rhonchi or rales.  Abdominal:     Palpations: Abdomen is soft.     Tenderness: There is no abdominal tenderness.  Musculoskeletal:  General: No swelling.     Cervical back: Neck supple.  Skin:    General: Skin is warm and dry.     Capillary Refill: Capillary refill takes less than 2 seconds.  Neurological:     General: No focal deficit present.     Mental Status: She is alert and oriented to person, place, and time.  Psychiatric:        Mood and Affect: Mood normal.     ED Results / Procedures / Treatments   Labs (all labs ordered are listed, but only abnormal results are displayed) Labs Reviewed  RESP PANEL BY RT-PCR (RSV, FLU A&B, COVID)  [id number]    EKG None  Radiology No results found.  Procedures Procedures    Medications Ordered in  ED Medications - No data to display  ED Course/ Medical Decision Making/ A&P                                 Medical Decision Making  Patient's respiratory panel negative for COVID influenza and RSV.  Will treat symptomatically as an upper respiratory infection.  Clinically no concerns for pneumonia oxygen saturations very good lungs very clear.   Final Clinical Impression(s) / ED Diagnoses Final diagnoses:  Viral upper respiratory tract infection    Rx / DC Orders ED Discharge Orders     None         Vanetta Mulders, MD 02/03/23 [phone]
